# Patient Record
Sex: Female | Born: 1995 | Race: Black or African American | Hispanic: No | Marital: Single | Smoking: Never smoker
Health system: Southern US, Community
[De-identification: ages and names within clinical notes are randomized; demographics above are authoritative.]

## PROBLEM LIST (undated history)

## (undated) ENCOUNTER — Inpatient Hospital Stay (HOSPITAL_COMMUNITY): Payer: Self-pay

## (undated) DIAGNOSIS — A749 Chlamydial infection, unspecified: Secondary | ICD-10-CM

## (undated) DIAGNOSIS — D649 Anemia, unspecified: Secondary | ICD-10-CM

## (undated) HISTORY — PX: NO PAST SURGERIES: SHX2092

## (undated) HISTORY — DX: Anemia, unspecified: D64.9

---
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
## 2017-03-20 ENCOUNTER — Ambulatory Visit: Payer: BLUE CROSS/BLUE SHIELD

## 2017-03-27 ENCOUNTER — Ambulatory Visit (INDEPENDENT_AMBULATORY_CARE_PROVIDER_SITE_OTHER): Payer: BLUE CROSS/BLUE SHIELD | Admitting: Pediatrics

## 2017-03-27 DIAGNOSIS — Z3042 Encounter for surveillance of injectable contraceptive: Secondary | ICD-10-CM | POA: Diagnosis not present

## 2017-03-27 MED ORDER — MEDROXYPROGESTERONE ACETATE 150 MG/ML IM SUSP
150.0000 mg | Freq: Once | INTRAMUSCULAR | Status: AC
  Administered 2017-03-27: 150 mg via INTRAMUSCULAR

## 2017-03-27 NOTE — Progress Notes (Signed)
Patient here for depo-provera  Agree with nursing staff's documentation of this patient's clinic encounter.  Baer Hinton, MD 03/27/2017 11:08 AM    

## 2017-06-12 ENCOUNTER — Other Ambulatory Visit: Payer: Self-pay | Admitting: Obstetrics

## 2017-06-12 DIAGNOSIS — Z30013 Encounter for initial prescription of injectable contraceptive: Secondary | ICD-10-CM

## 2017-06-17 ENCOUNTER — Ambulatory Visit: Payer: BLUE CROSS/BLUE SHIELD

## 2017-06-24 ENCOUNTER — Ambulatory Visit (INDEPENDENT_AMBULATORY_CARE_PROVIDER_SITE_OTHER): Payer: BLUE CROSS/BLUE SHIELD | Admitting: *Deleted

## 2017-06-24 VITALS — BP 113/72 | HR 79 | Wt 170.0 lb

## 2017-06-24 DIAGNOSIS — Z3042 Encounter for surveillance of injectable contraceptive: Secondary | ICD-10-CM | POA: Diagnosis not present

## 2017-06-24 MED ORDER — MEDROXYPROGESTERONE ACETATE 150 MG/ML IM SUSP
150.0000 mg | Freq: Once | INTRAMUSCULAR | Status: AC
  Administered 2017-06-24: 150 mg via INTRAMUSCULAR

## 2017-06-24 NOTE — Progress Notes (Signed)
I have reviewed the chart and agree with nursing staff's documentation of this patient's encounter.  Catalina AntiguaPeggy Kaylah Chiasson, MD 06/24/2017 1:11 PM

## 2017-06-24 NOTE — Progress Notes (Signed)
Pt is in office today for Depo injection. Pt is on time for injection. Injection given, pt tolerated well. Pt has no other concerns today. [facility] next Depo injection.  BP 113/72   Pulse 79   Wt 170 lb (77.1 kg)   BMI 30.11 kg/m    Administrations This Visit    medroxyPROGESTERone (DEPO-PROVERA) injection 150 mg    Admin Date 06/24/2017 Action Given Dose 150 mg Route Intramuscular Administered By Lanney GinsFoster, Suzanne D, CMA

## 2017-09-12 ENCOUNTER — Other Ambulatory Visit: Payer: Self-pay | Admitting: Obstetrics

## 2017-09-12 DIAGNOSIS — Z30013 Encounter for initial prescription of injectable contraceptive: Secondary | ICD-10-CM

## 2017-09-16 ENCOUNTER — Ambulatory Visit: Payer: BLUE CROSS/BLUE SHIELD

## 2017-09-16 ENCOUNTER — Encounter: Payer: Self-pay | Admitting: Obstetrics

## 2017-09-23 ENCOUNTER — Telehealth: Payer: Self-pay | Admitting: *Deleted

## 2017-09-23 NOTE — Telephone Encounter (Signed)
Left vmail for patient to callback and reschedule missed DEPO shot appt.

## 2017-10-04 ENCOUNTER — Other Ambulatory Visit (HOSPITAL_COMMUNITY)
Admission: RE | Admit: 2017-10-04 | Discharge: 2017-10-04 | Disposition: A | Discharge status: Home / Self Care | Payer: BLUE CROSS/BLUE SHIELD | Source: Ambulatory Visit | Attending: Obstetrics | Admitting: Obstetrics

## 2017-10-04 ENCOUNTER — Encounter: Payer: Self-pay | Admitting: Obstetrics

## 2017-10-04 ENCOUNTER — Ambulatory Visit (INDEPENDENT_AMBULATORY_CARE_PROVIDER_SITE_OTHER): Payer: BLUE CROSS/BLUE SHIELD | Admitting: Obstetrics

## 2017-10-04 VITALS — BP 113/71 | Ht 63.0 in | Wt 171.2 lb

## 2017-10-04 DIAGNOSIS — Z30016 Encounter for initial prescription of transdermal patch hormonal contraceptive device: Secondary | ICD-10-CM

## 2017-10-04 DIAGNOSIS — Z3202 Encounter for pregnancy test, result negative: Secondary | ICD-10-CM

## 2017-10-04 DIAGNOSIS — Z3009 Encounter for other general counseling and advice on contraception: Secondary | ICD-10-CM

## 2017-10-04 DIAGNOSIS — Z113 Encounter for screening for infections with a predominantly sexual mode of transmission: Secondary | ICD-10-CM

## 2017-10-04 DIAGNOSIS — N898 Other specified noninflammatory disorders of vagina: Secondary | ICD-10-CM

## 2017-10-04 DIAGNOSIS — Z3042 Encounter for surveillance of injectable contraceptive: Secondary | ICD-10-CM

## 2017-10-04 DIAGNOSIS — Z01419 Encounter for gynecological examination (general) (routine) without abnormal findings: Secondary | ICD-10-CM

## 2017-10-04 LAB — POCT URINE PREGNANCY: Preg Test, Ur: NEGATIVE

## 2017-10-04 MED ORDER — NORELGESTROMIN-ETH ESTRADIOL 150-35 MCG/24HR TD PTWK: 1.0000 | MEDICATED_PATCH | TRANSDERMAL | 12 refills | Status: DC

## 2017-10-04 NOTE — Progress Notes (Signed)
Pt presents for annual and all STD testing. Pt wishes to change birth control from Depo to pill or patch.

## 2017-10-04 NOTE — Progress Notes (Signed)
Subjective:        Carol Oconnell is a 22 y.o. female here for a routine exam.  Current complaints: None.    Personal health questionnaire:  Is patient Carol Oconnell, have a family history of breast and/or ovarian cancer: no Is there a family history of uterine cancer diagnosed at age < 3150, gastrointestinal cancer, urinary tract cancer, family member who is a Carol officerLynch syndrome-associated carrier: no Is the patient overweight and hypertensive, family history of diabetes, personal history of gestational diabetes, preeclampsia or PCOS: no Is patient over 4755, have PCOS,  family history of premature CHD under age 20, diabetes, smoke, have hypertension or peripheral artery disease:  no At any time, has a partner hit, kicked or otherwise hurt or frightened you?: no Over the past 2 weeks, have you felt down, depressed or hopeless?: no Over the past 2 weeks, have you felt little interest or pleasure in doing things?:no   Gynecologic History No LMP recorded. Patient has had an injection. Contraception: Depo-Provera injections Last Pap: n/a. Results were: n/a Last mammogram: n/a. Results were: n/a  Obstetric History OB History  Gravida Para Term Preterm AB Living  2 2 2     2   SAB TAB Ectopic Multiple Live Births        0 2    # Outcome Date GA Lbr Len/2nd Weight Sex Delivery Anes PTL Lv  2 Term 07/21/15 1447w1d 02:02 / 00:05 7 lb 6.9 oz (3.37 kg) M Vag-Spont None  LIV  1 Term 01/12/14 4242w4d  7 lb 7.8 oz (3.395 kg) M Vag-Spont EPI  LIV    Past Medical History:  Diagnosis Date  . Anemia   . Chlamydia     Past Surgical History:  Procedure Laterality Date  . NO PAST SURGERIES       Current Outpatient Medications:  .  acetaminophen (TYLENOL) 500 MG tablet, Take 1,000 mg by mouth every 6 (six) hours as needed for mild pain. Reported on 09/01/2015, Disp: , Rfl:  .  medroxyPROGESTERone (DEPO-PROVERA) 150 MG/ML injection, USE AS DIRECTED AT PHYSICIAN'S OFFICE EVERY 3 MONTHS (Patient not  taking: Reported on 10/04/2017), Disp: 1 mL, Rfl: 3 .  norelgestromin-ethinyl estradiol (XULANE) 150-35 MCG/24HR transdermal patch, Place 1 patch onto the skin once a week., Disp: 3 patch, Rfl: 12 No Known Allergies  Social History   Tobacco Use  . Smoking status: Never Smoker  . Smokeless tobacco: Never Used  Substance Use Topics  . Alcohol use: No    Family History  Problem Relation Age of Onset  . Hypertension Mother   . Asthma Brother       Review of Systems  Constitutional: negative for fatigue and weight loss Respiratory: negative for cough and wheezing Cardiovascular: negative for chest pain, fatigue and palpitations Gastrointestinal: negative for abdominal pain and change in bowel habits Musculoskeletal:negative for myalgias Neurological: negative for gait problems and tremors Behavioral/Psych: negative for abusive relationship, depression Endocrine: negative for temperature intolerance    Genitourinary:negative for abnormal menstrual periods, genital lesions, hot flashes, sexual problems and vaginal discharge Integument/breast: negative for breast lump, breast tenderness, nipple discharge and skin lesion(s)    Objective:       BP 113/71   Ht 5\' 3"  (1.6 m)   Wt 171 lb 3.2 oz (77.7 kg)   BMI 30.33 kg/m  General:   alert  Skin:   no rash or abnormalities  Lungs:   clear to auscultation bilaterally  Heart:   regular rate and  rhythm, S1, S2 normal, no murmur, click, rub or gallop  Breasts:   normal without suspicious masses, skin or nipple changes or axillary nodes  Abdomen:  normal findings: no organomegaly, soft, non-tender and no hernia  Pelvis:  External genitalia: normal general appearance Urinary system: urethral meatus normal and bladder without fullness, nontender Vaginal: normal without tenderness, induration or masses Cervix: normal appearance Adnexa: normal bimanual exam Uterus: anteverted and non-tender, normal size   Lab Review Urine pregnancy  test Labs reviewed yes Radiologic studies reviewed no  50% of 20 min visit spent on counseling and coordination of care.   Assessment:     1. Encounter for gynecological examination  2. Vaginal discharge Rx: - Cervicovaginal ancillary only  3. Encounter for surveillance of injectable contraceptive - wants to discontinue Depo  4. Encounter for other general counseling and advice on contraception - wants to start the Patch  5. Encounter for initial prescription of transdermal patch hormonal contraceptive device Rx: - POCT urine pregnancy - norelgestromin-ethinyl estradiol [employer]) 150-35 MCG/24HR transdermal patch; Place 1 patch onto the skin once a week.  Dispense: 3 patch; Refill: 12  6. Screening for STD (sexually transmitted disease) Rx: - Hepatitis B surface antigen - Hepatitis C antibody - HIV antibody - RPR    Plan:    Education reviewed: calcium supplements, depression evaluation, low fat, low cholesterol diet, safe sex/STD prevention, self breast exams and weight bearing exercise. Contraception: Ortho-Evra patches weekly. Follow up in: 1 year.   Meds ordered this encounter  Medications  . norelgestromin-ethinyl estradiol [employer]) 150-35 MCG/24HR transdermal patch    Sig: Place 1 patch onto the skin once a week.    Dispense:  3 patch    Refill:  12   Orders Placed This Encounter  Procedures  . Hepatitis B surface antigen  . Hepatitis C antibody  . HIV antibody  . RPR  . POCT urine pregnancy    Brock Bad MD 10-04-2017

## 2017-10-05 LAB — RPR: RPR: NONREACTIVE

## 2017-10-05 LAB — HEPATITIS B SURFACE ANTIGEN: Hepatitis B Surface Ag: NEGATIVE

## 2017-10-05 LAB — HIV ANTIBODY (ROUTINE TESTING W REFLEX): HIV SCREEN 4TH GENERATION: NONREACTIVE

## 2017-10-05 LAB — HEPATITIS C ANTIBODY: Hep C Virus Ab: <0.1 s/co ratio (ref 0.0–0.9)

## 2017-10-08 LAB — CERVICOVAGINAL ANCILLARY ONLY
BACTERIAL VAGINITIS: POSITIVE — AB
CANDIDA VAGINITIS: POSITIVE — AB
CHLAMYDIA, DNA PROBE: POSITIVE — AB
Neisseria Gonorrhea: NEGATIVE
Trichomonas: NEGATIVE

## 2017-10-09 ENCOUNTER — Other Ambulatory Visit: Payer: Self-pay | Admitting: Obstetrics

## 2017-10-09 DIAGNOSIS — B3731 Acute candidiasis of vulva and vagina: Secondary | ICD-10-CM

## 2017-10-09 DIAGNOSIS — A749 Chlamydial infection, unspecified: Secondary | ICD-10-CM

## 2017-10-09 DIAGNOSIS — B9689 Other specified bacterial agents as the cause of diseases classified elsewhere: Secondary | ICD-10-CM

## 2017-10-09 DIAGNOSIS — N760 Acute vaginitis: Secondary | ICD-10-CM

## 2017-10-09 DIAGNOSIS — B373 Candidiasis of vulva and vagina: Secondary | ICD-10-CM

## 2017-10-09 MED ORDER — TINIDAZOLE 500 MG PO TABS: 1000.0000 mg | ORAL_TABLET | Freq: Every day | ORAL | 2 refills | Status: DC

## 2017-10-09 MED ORDER — CEFIXIME 400 MG PO CAPS: 400.0000 mg | ORAL_CAPSULE | Freq: Once | ORAL | 0 refills | Status: AC

## 2017-10-09 MED ORDER — FLUCONAZOLE 150 MG PO TABS: 150.0000 mg | ORAL_TABLET | Freq: Once | ORAL | 0 refills | Status: AC

## 2017-10-09 MED ORDER — AZITHROMYCIN 500 MG PO TABS
1000.0000 mg | ORAL_TABLET | Freq: Once | ORAL | 0 refills | Status: AC
Start: 2017-10-09 — End: 2017-10-09

## 2018-02-17 ENCOUNTER — Other Ambulatory Visit: Payer: Self-pay

## 2018-02-17 ENCOUNTER — Ambulatory Visit (INDEPENDENT_AMBULATORY_CARE_PROVIDER_SITE_OTHER): Payer: Self-pay | Admitting: Family Medicine

## 2018-02-17 VITALS — BP 110/60 | HR 94 | Temp 98.3°F | Ht 63.0 in | Wt 171.0 lb

## 2018-02-17 DIAGNOSIS — Z3202 Encounter for pregnancy test, result negative: Secondary | ICD-10-CM

## 2018-02-17 DIAGNOSIS — N926 Irregular menstruation, unspecified: Secondary | ICD-10-CM | POA: Insufficient documentation

## 2018-02-17 LAB — POCT URINE PREGNANCY: PREG TEST UR: NEGATIVE

## 2018-02-17 NOTE — Progress Notes (Signed)
   Subjective   Patient ID: Carol Oconnell    DOB: 14-Jan-1996, 22 y.o. female   MRN: [id number]  CC: "missed period"  HPI: Carol Oconnell is a 22 y.o. female who presents to clinic today for the following:  ABNORMAL UTERIN BLEEDING  Having absent vaginal bleeding for 22 days past last LMP. Bleeding is: absent, was 5-7 days heavy every 28 day cycles Sex in last month: yes Possible STD exposure: yes, had GC/Chlamydia, no h/o PID or h/o abdominal pain Family history of uterine or vaginal cancer: no  Symptoms Weight loss: no Weight gain: yes since 2018 after depo, otherwise no Trouble with vision: no Headaches: no but has h/o frontal h/a Dysuria: no Abdomen or pelvic pain: no Back pain: no Genital sores or ulcers: no Pain during sex: no   ROS: see HPI for pertinent.  PMFSH: Reviewed. Smoking status reviewed. Medications reviewed.  Objective   BP 110/60 (BP Location: Left Arm, Patient Position: Sitting, Cuff Size: Normal)   Pulse 94   Temp 98.3 F (36.8 C) (Oral)   Ht 5\' 3"  (1.6 m)   Wt 171 lb (77.6 kg)   LMP 12/23/2017   SpO2 100%   BMI 30.29 kg/m  Vitals and nursing note reviewed.  General: well nourished, well developed, NAD with non-toxic appearance HEENT: normocephalic, atraumatic, moist mucous membranes Cardiovascular: regular rate and rhythm without murmurs, rubs, or gallops Lungs: clear to auscultation bilaterally with normal work of breathing Abdomen: soft, non-tender, non-distended, normoactive bowel sounds Skin: warm, dry, no rashes or lesions, cap refill < 2 seconds Extremities: warm and well perfused, normal tone, no edema  Assessment & Plan   Irregular menses Acute on chronic.  Seems to be ovulatory in nature.  Urine pregnancy negative.  Patient is attempting to conceive. - Reassured patient that this may take a few months to resolve and to contact clinic at the beginning of next year if she continues to be without periods for referral to OB/GYN -  Reviewed return precautions  Orders Placed This Encounter  Procedures  . POCT urine pregnancy   No orders of the defined types were placed in this encounter.   Durward Parcelavid McMullen, DO [facility], PGY-3 02/17/2018, 5:10 PM

## 2018-02-17 NOTE — Assessment & Plan Note (Signed)
Acute on chronic.  Seems to be ovulatory in nature.  Urine pregnancy negative.  Patient is attempting to conceive. - Reassured patient that this may take a few months to resolve and to contact clinic at the beginning of next year if she continues to be without periods for referral to OB/GYN - Reviewed return precautions

## 2018-02-17 NOTE — Patient Instructions (Signed)
Thank you for coming in to see us today. Please see below to review our plan for today's visit.  Your urine pregnancy test was negative.  Abnormal uterine bleeds are a common issue in females.  This may take several months to resolve.  Contact our clinic at the beginning of next year if you continue to have issues and we can arrange for you to see an OB/GYN doctor.  This typically will resolve on its own.  Please call the clinic at [phone] if your symptoms worsen or you have any concerns. It was our pleasure to serve you.  Durward Parcelavid McMullen, DO [facility], PGY-3

## 2018-04-21 ENCOUNTER — Other Ambulatory Visit: Payer: Self-pay

## 2018-04-21 ENCOUNTER — Ambulatory Visit (INDEPENDENT_AMBULATORY_CARE_PROVIDER_SITE_OTHER): Payer: Medicaid Other | Admitting: Student in an Organized Health Care Education/Training Program

## 2018-04-21 ENCOUNTER — Encounter: Payer: Self-pay | Admitting: Student in an Organized Health Care Education/Training Program

## 2018-04-21 VITALS — BP 104/52 | HR 83 | Temp 98.3°F | Wt 168.4 lb

## 2018-04-21 DIAGNOSIS — Z3200 Encounter for pregnancy test, result unknown: Secondary | ICD-10-CM

## 2018-04-21 DIAGNOSIS — N912 Amenorrhea, unspecified: Secondary | ICD-10-CM

## 2018-04-21 DIAGNOSIS — N926 Irregular menstruation, unspecified: Secondary | ICD-10-CM | POA: Diagnosis not present

## 2018-04-21 LAB — POCT URINE PREGNANCY: Preg Test, Ur: NEGATIVE

## 2018-04-21 NOTE — Patient Instructions (Signed)
It was a pleasure seeing you today in our clinic. Here is the treatment plan we have discussed and agreed upon together:  We drew blood work at today's visit. I will call or send you a letter with these results. If you do not hear from me within the next week, please give our office a call.  Our clinic's number is [phone]. Please call with questions or concerns about what we discussed today.  Be well, Dr. Cleota Pellerito   

## 2018-04-21 NOTE — Progress Notes (Signed)
   CC: amenorhea   HPI: Carol Oconnell is a 23 y.o. female with PMH:  Past Medical History:  Diagnosis Date  . Anemia   . Chlamydia    Presenting for amenorrhea.  Patient reports that she has had 5 months of amenorrhea.  She reports that she was previously on Depo-Provera which was last given in April or May.  She has been off of birth control since August for her report.  She reports that she has never had normal periods, with menarche at age 13.  She was subsequently started on OCPs.  She then had two pregnancies and after the second one was started on Depo-Provera.  She was on Depo-Provera for 1 year ending this past spring.  She reports no abdominal pain, no diarrhea or constipation.  No palpitations or chest pain.  No changes in her hair.  She has not noticed any body hair or acne.  Review of Symptoms:  See HPI for ROS.   CC, SH/smoking status, and VS noted.  Objective: BP (!) 104/52   Pulse 83   Temp 98.3 F (36.8 C) (Oral)   Wt 168 lb 6 oz (76.4 kg)   LMP 12/20/2017   SpO2 97%   BMI 29.83 kg/m  GEN: NAD, alert, cooperative, and pleasant. ENMT: normal tympanic light reflex, no nasal polyps,no rhinorrhea, no pharyngeal erythema or exudates NECK: full ROM, no thyromegaly RESPIRATORY: clear to auscultation bilaterally with no wheezes, rhonchi or rales, good effort CV: RRR, no m/r/g, no peripheral edema GI: soft, non-tender, non-distended, no hepatosplenomegaly SKIN: warm and dry, no rashes or lesions NEURO: II-XII grossly intact, normal gait, peripheral sensation intact PSYCH: AAOx3, appropriate affect  Assessment and plan:  Irregular menses Patient has never had normal menses, but has also been on contraception or pregnant almost consistently over the past several years. 4-5 months of amenorrhea deserves a workup for PCOS. Physical exam is reassuring, patient is overweight but no other evidence of hirsutism. - prolactin WNL - TSH WNL - bHCG neg - A1c WNL - can  consider ordering a pelvic ultrasound to rule out PCOS   Orders Placed This Encounter  Procedures  . TSH  . Prolactin  . Hemoglobin A1c  . Follicle stimulating hormone  . Luteinizing hormone  . POCT urine pregnancy    Howard Pouch, MD,MS,  PGY3 04/23/2018 5:28 PM

## 2018-04-22 LAB — LUTEINIZING HORMONE: LH: 23.8 m[IU]/mL

## 2018-04-22 LAB — HEMOGLOBIN A1C
ESTIMATED AVERAGE GLUCOSE: 103 mg/dL
HEMOGLOBIN A1C: 5.2 % (ref 4.8–5.6)

## 2018-04-22 LAB — PROLACTIN: PROLACTIN: 6 ng/mL (ref 4.8–23.3)

## 2018-04-22 LAB — TSH: TSH: 1.64 u[IU]/mL (ref 0.450–4.500)

## 2018-04-22 LAB — FOLLICLE STIMULATING HORMONE: FSH: 6.6 m[IU]/mL

## 2018-04-23 ENCOUNTER — Other Ambulatory Visit: Payer: Self-pay | Admitting: Student in an Organized Health Care Education/Training Program

## 2018-04-23 NOTE — Assessment & Plan Note (Addendum)
Patient has never had normal menses, but has also been on contraception or pregnant almost consistently over the past several years. 4-5 months of amenorrhea deserves a workup for PCOS. Physical exam is reassuring, patient is overweight but no other evidence of hirsutism. - prolactin WNL - TSH WNL - bHCG neg - A1c WNL - can consider ordering a pelvic ultrasound to rule out PCOS

## 2018-12-11 ENCOUNTER — Other Ambulatory Visit: Payer: Self-pay

## 2018-12-11 DIAGNOSIS — Z20822 Contact with and (suspected) exposure to covid-19: Secondary | ICD-10-CM

## 2018-12-13 LAB — NOVEL CORONAVIRUS, NAA: SARS-CoV-2, NAA: NOT DETECTED

## 2019-01-05 ENCOUNTER — Other Ambulatory Visit: Payer: Self-pay

## 2019-01-05 ENCOUNTER — Emergency Department (HOSPITAL_COMMUNITY)
Admission: EM | Admit: 2019-01-05 | Discharge: 2019-01-05 | Disposition: A | Discharge status: Home / Self Care | Payer: No Typology Code available for payment source | Attending: Emergency Medicine | Admitting: Emergency Medicine

## 2019-01-05 ENCOUNTER — Encounter (HOSPITAL_COMMUNITY): Payer: Self-pay

## 2019-01-05 ENCOUNTER — Emergency Department (HOSPITAL_COMMUNITY): Payer: No Typology Code available for payment source

## 2019-01-05 DIAGNOSIS — S339XXA Sprain of unspecified parts of lumbar spine and pelvis, initial encounter: Secondary | ICD-10-CM | POA: Insufficient documentation

## 2019-01-05 DIAGNOSIS — Z79899 Other long term (current) drug therapy: Secondary | ICD-10-CM | POA: Diagnosis not present

## 2019-01-05 DIAGNOSIS — Y999 Unspecified external cause status: Secondary | ICD-10-CM | POA: Diagnosis not present

## 2019-01-05 DIAGNOSIS — Y93I9 Activity, other involving external motion: Secondary | ICD-10-CM | POA: Insufficient documentation

## 2019-01-05 DIAGNOSIS — S39012A Strain of muscle, fascia and tendon of lower back, initial encounter: Secondary | ICD-10-CM

## 2019-01-05 DIAGNOSIS — Y92410 Unspecified street and highway as the place of occurrence of the external cause: Secondary | ICD-10-CM | POA: Diagnosis not present

## 2019-01-05 DIAGNOSIS — S3992XA Unspecified injury of lower back, initial encounter: Secondary | ICD-10-CM | POA: Diagnosis present

## 2019-01-05 LAB — URINALYSIS, ROUTINE W REFLEX MICROSCOPIC
Bilirubin Urine: NEGATIVE
Glucose, UA: NEGATIVE mg/dL
Hgb urine dipstick: NEGATIVE
Ketones, ur: NEGATIVE mg/dL
Nitrite: NEGATIVE
Protein, ur: NEGATIVE mg/dL
Specific Gravity, Urine: 1.027 (ref 1.005–1.030)
pH: 6 (ref 5.0–8.0)

## 2019-01-05 LAB — PREGNANCY, URINE: Preg Test, Ur: NEGATIVE

## 2019-01-05 MED ORDER — IBUPROFEN 400 MG PO TABS
600.0000 mg | ORAL_TABLET | Freq: Once | ORAL | Status: AC
  Administered 2019-01-05: 600 mg via ORAL
  Filled 2019-01-05: qty 1
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
## 2019-03-17 ENCOUNTER — Ambulatory Visit: Payer: Medicaid Other | Attending: Internal Medicine

## 2019-03-17 DIAGNOSIS — Z20822 Contact with and (suspected) exposure to covid-19: Secondary | ICD-10-CM

## 2019-03-18 LAB — NOVEL CORONAVIRUS, NAA: SARS-CoV-2, NAA: NOT DETECTED

## 2019-03-28 ENCOUNTER — Emergency Department (HOSPITAL_COMMUNITY)
Admission: EM | Admit: 2019-03-28 | Discharge: 2019-03-28 | Disposition: A | Discharge status: Home / Self Care | Payer: Medicaid Other | Attending: Emergency Medicine | Admitting: Emergency Medicine

## 2019-03-28 ENCOUNTER — Other Ambulatory Visit: Payer: Self-pay

## 2019-03-28 ENCOUNTER — Encounter (HOSPITAL_COMMUNITY): Payer: Self-pay | Admitting: Emergency Medicine

## 2019-03-28 DIAGNOSIS — N938 Other specified abnormal uterine and vaginal bleeding: Secondary | ICD-10-CM | POA: Insufficient documentation

## 2019-03-28 LAB — COMPREHENSIVE METABOLIC PANEL
ALT: 38 U/L (ref 0–44)
AST: 59 U/L — ABNORMAL HIGH (ref 15–41)
Albumin: 4.1 g/dL (ref 3.5–5.0)
Alkaline Phosphatase: 57 U/L (ref 38–126)
Anion gap: 6 (ref 5–15)
BUN: 9 mg/dL (ref 6–20)
CO2: 28 mmol/L (ref 22–32)
Calcium: 9.1 mg/dL (ref 8.9–10.3)
Chloride: 108 mmol/L (ref 98–111)
Creatinine, Ser: 0.88 mg/dL (ref 0.44–1.00)
GFR calc Af Amer: >60 mL/min (ref >60)
GFR calc non Af Amer: >60 mL/min (ref >60)
Glucose, Bld: 99 mg/dL (ref 70–99)
Potassium: 4.2 mmol/L (ref 3.5–5.1)
Sodium: 142 mmol/L (ref 135–145)
Total Bilirubin: 0.8 mg/dL (ref 0.3–1.2)
Total Protein: 7 g/dL (ref 6.5–8.1)

## 2019-03-28 LAB — CBC
HCT: 37.1 % (ref 36.0–46.0)
Hemoglobin: 12.5 g/dL (ref 12.0–15.0)
MCH: 32.9 pg (ref 26.0–34.0)
MCHC: 33.7 g/dL (ref 30.0–36.0)
MCV: 97.6 fL (ref 80.0–100.0)
Platelets: 247 10*3/uL (ref 150–400)
RBC: 3.8 MIL/uL — ABNORMAL LOW (ref 3.87–5.11)
RDW: 12 % (ref 11.5–15.5)
WBC: 7 10*3/uL (ref 4.0–10.5)
nRBC: 0 % (ref 0.0–0.2)

## 2019-03-28 LAB — WET PREP, GENITAL
Clue Cells Wet Prep HPF POC: NONE SEEN
Sperm: NONE SEEN
Trich, Wet Prep: NONE SEEN
Yeast Wet Prep HPF POC: NONE SEEN

## 2019-03-28 LAB — I-STAT BETA HCG BLOOD, ED (MC, WL, AP ONLY): I-stat hCG, quantitative: <5 m[IU]/mL (ref <5)

## 2019-03-28 LAB — LIPASE, BLOOD: Lipase: 32 U/L (ref 11–51)

## 2019-03-28 MED ORDER — SODIUM CHLORIDE 0.9% FLUSH: 3.0000 mL | Freq: Once | INTRAVENOUS | Status: DC

## 2019-03-28 NOTE — Discharge Instructions (Addendum)
   Your work-up showed no significant abnormalities today.  You will need to follow very closely with a gynecologist.  You may [facility] with [facility] the [address].  It may be difficult to contact them by phone but they do have open hours after 4 PM Monday through Wednesday and you may go directly to the clinic to set up an appointment.  Otherwise you may follow-up with any GYN specialist of your choosing.  You may need to see your primary care physician for referral.  Contact a health care provider if you: Feel light-headed or weak. Have nausea and vomiting. Cannot eat or drink without vomiting. Feel dizzy or have diarrhea while you are taking medicines. Are taking birth control pills or hormones, and you want to change them or stop taking them. Get help right away if: You develop a fever or chills. You need to change your sanitary pad or tampon more than one time per hour. Your vaginal bleeding becomes heavier, or your flow contains clots more often. You develop pain in your abdomen. You lose consciousness. You develop a rash.

## 2019-03-28 NOTE — ED Provider Notes (Signed)
[facility] EMERGENCY DEPARTMENT Provider Note   CSN: [id number] Arrival date & time: 03/28/19  1056     History Chief Complaint  Patient presents with  . Vaginal Bleeding    Carol Oconnell is a 24 y.o. female who presents emergency department chief complaint of vaginal bleeding.  Patient states that she has not had a period for the past 3 years.  She used to be on birth control.  She discontinued it 3 years ago and expected her.  To restart however it never did.  She states that she began bleeding on 03/26/2019.  She states that she normally had a heavy.  However she is soaking a super tampon every 2 hours.  She denies any severe menstrual cramps, vaginal foul odor or discharge.  She states she is not currently sexually active.  She denies fevers, chills, urinary symptoms.  She denies weakness, shortness of breath, feelings of presyncope.  HPI     Past Medical History:  Diagnosis Date  . Anemia   . Chlamydia     Patient Active Problem List   Diagnosis Date Noted  . Irregular menses 02/17/2018  . Routine adult health maintenance 02/21/2016    Past Surgical History:  Procedure Laterality Date  . NO PAST SURGERIES       OB History    Gravida  2   Para  2   Term  2   Preterm      AB      Living  2     SAB      TAB      Ectopic      Multiple  0   Live Births  2           Family History  Problem Relation Age of Onset  . Hypertension Mother   . Asthma Brother     Social History   Tobacco Use  . Smoking status: Never Smoker  . Smokeless tobacco: Never Used  Substance Use Topics  . Alcohol use: No  . Drug use: No    Home Medications Prior to Admission medications   Medication Sig Start Date End Date Taking? Authorizing Provider  acetaminophen (TYLENOL) 500 MG tablet Take 1,000 mg by mouth every 6 (six) hours as needed for mild pain. Reported on 09/01/2015    [provider]  norelgestromin-ethinyl estradiol [employer])  150-35 MCG/24HR transdermal patch Place 1 patch onto the skin once a week. 10/04/17   Brock Bad, MD  tinidazole (TINDAMAX) 500 MG tablet Take 2 tablets (1,000 mg total) by mouth daily with breakfast. 10/09/17   Brock Bad, MD    Allergies    Patient has no known allergies.  Review of Systems   Review of Systems Ten systems reviewed and are negative for acute change, except as noted in the HPI.   Physical Exam Updated Vital Signs BP (!) 107/59 (BP Location: Right Arm)   Pulse 67   Temp 98.4 F (36.9 C) (Oral)   Resp 16   LMP 03/26/2019   SpO2 97%   Physical Exam Vitals and nursing note reviewed. Exam conducted with a chaperone present.  Constitutional:      General: She is not in acute distress.    Appearance: She is well-developed. She is not diaphoretic.  HENT:     Head: Normocephalic and atraumatic.  Eyes:     General: No scleral icterus.    Conjunctiva/sclera: Conjunctivae normal.  Cardiovascular:     Rate  and Rhythm: Normal rate and regular rhythm.     Heart sounds: Normal heart sounds. No murmur. No friction rub. No gallop.   Pulmonary:     Effort: Pulmonary effort is normal. No respiratory distress.     Breath sounds: Normal breath sounds.  Abdominal:     General: Bowel sounds are normal. There is no distension.     Palpations: Abdomen is soft. There is no mass.     Tenderness: There is no abdominal tenderness. There is no guarding.  Genitourinary:    General: Normal vulva.     Vagina: Normal.     Cervix: Cervical bleeding present. No cervical motion tenderness.     Uterus: Normal.      Adnexa: Right adnexa normal and left adnexa normal.  Musculoskeletal:     Cervical back: Normal range of motion.  Skin:    General: Skin is warm and dry.  Neurological:     Mental Status: She is alert and oriented to person, place, and time.  Psychiatric:        Behavior: Behavior normal.     ED Results / Procedures / Treatments   Labs (all labs ordered  are listed, but only abnormal results are displayed) Labs Reviewed  LIPASE, BLOOD  COMPREHENSIVE METABOLIC PANEL  CBC  URINALYSIS, ROUTINE W REFLEX MICROSCOPIC  I-STAT BETA HCG BLOOD, ED ([facility], [facility], [facility] ONLY)    EKG None  Radiology No results found.  Procedures Procedures (including critical care time)  Medications Ordered in ED Medications  sodium chloride flush (NS) 0.9 % injection 3 mL (has no administration in time range)    ED Course  I have reviewed the triage vital signs and the nursing notes.  Pertinent labs & imaging results that were available during my care of the patient were reviewed by me and considered in my medical decision making (see chart for details).  Clinical Course as of Mar 27 1213  Sat Mar 28, 2019  1215 I-stat hCG, quantitative: <5.0 [AH]    Clinical Course User Index [AH] Margarita Mail, PA-C   MDM Rules/Calculators/A&P                      24 year old female who presents with vaginal bleeding.  She has no pelvic pain or vaginal discharge.  I personally viewed the patient's labs which shows pending GC chlamydia swab, wet prep shows a few white blood cells.  CMP shows a mildly elevated AST of insignificant value.  Hemoglobin 12.5.  She has a negative pregnancy test.  Patient also has a benign pelvic examination.  This likely represents dysfunctional uterine bleeding.  I do not see any evidence of vaginal laceration and cervical laceration or other physical cause of bleeding.  She is not feeling dizzy with standing that I doubt her bleeding is leading to significant drop in her hemoglobin.  Her she is hemodynamically stable and have advised the patient to follow closely with the gynecology.  She appears appropriate for discharge at this time Final Clinical Impression(s) / ED Diagnoses Final diagnoses:  Dysfunctional uterine bleeding    Rx / DC Orders ED Discharge Orders    None       Margarita Mail, PA-C 03/28/19 1630    Fredia Sorrow,  MD 04/05/19 [phone]

## 2019-03-28 NOTE — ED Triage Notes (Signed)
Pt states she hasn't had a menstrual cycle in 3 years and started having abd cramping and vaginal bleeding since 12/31.  Initially started as spotting but now heavy with super tampons changed q2 hrs.

## 2019-03-28 NOTE — ED Notes (Signed)
Patient asked for urine sample, she states that she is unable to give sample at this time.

## 2019-03-28 NOTE — ED Notes (Signed)
Patient Alert and oriented to baseline. Stable and ambulatory to baseline. Patient verbalized understanding of the discharge instructions.  Patient belongings were taken by the patient.   

## 2019-03-28 NOTE — ED Notes (Signed)
Patient asked for urine sample, she stated that she was unable to give sample at this time.

## 2019-03-30 LAB — GC/CHLAMYDIA PROBE AMP (~~LOC~~) NOT AT ARMC
Chlamydia: NEGATIVE
Neisseria Gonorrhea: NEGATIVE

## 2019-04-16 ENCOUNTER — Other Ambulatory Visit: Payer: Medicaid Other

## 2019-11-25 ENCOUNTER — Other Ambulatory Visit: Payer: Self-pay

## 2019-11-25 ENCOUNTER — Emergency Department (HOSPITAL_COMMUNITY)
Admission: EM | Admit: 2019-11-25 | Discharge: 2019-11-25 | Disposition: A | Discharge status: Home / Self Care | Payer: HRSA Program | Attending: Emergency Medicine | Admitting: Emergency Medicine

## 2019-11-25 ENCOUNTER — Emergency Department (HOSPITAL_COMMUNITY): Payer: HRSA Program

## 2019-11-25 ENCOUNTER — Encounter (HOSPITAL_COMMUNITY): Payer: Self-pay | Admitting: Emergency Medicine

## 2019-11-25 DIAGNOSIS — U071 COVID-19: Secondary | ICD-10-CM | POA: Insufficient documentation

## 2019-11-25 DIAGNOSIS — R000 Tachycardia, unspecified: Secondary | ICD-10-CM | POA: Insufficient documentation

## 2019-11-25 LAB — SARS CORONAVIRUS 2 BY RT PCR (HOSPITAL ORDER, PERFORMED IN ~~LOC~~ HOSPITAL LAB): SARS Coronavirus 2: POSITIVE — AB

## 2019-11-25 MED ORDER — IPRATROPIUM BROMIDE HFA 17 MCG/ACT IN AERS
2.0000 | INHALATION_SPRAY | Freq: Once | RESPIRATORY_TRACT | Status: AC
  Administered 2019-11-25: 2 via RESPIRATORY_TRACT
  Filled 2019-11-25: qty 12.9

## 2019-11-25 MED ORDER — ACETAMINOPHEN 325 MG PO TABS
650.0000 mg | ORAL_TABLET | Freq: Once | ORAL | Status: AC
  Administered 2019-11-25: 650 mg via ORAL
  Filled 2019-11-25: qty 2

## 2019-11-25 NOTE — ED Provider Notes (Signed)
[facility] EMERGENCY DEPARTMENT Provider Note   CSN: [id number] Arrival date & time: 11/25/19  1324     History Chief Complaint  Patient presents with  . covid sx    Carol Oconnell is a 24 y.o. female who presents to the ED with complaint of COVID symptoms since last night. Pt is unvaccinated; she states that she was around a friend 2 days ago who tested positive. Pt began having chills, a headache, cough, SOB, sore throat, and body aches yesterday. She has been taking Tylenol at home for her headache without much relief. Has not taken anything else for her symptoms. Pt has not been checking her temperature at home as she does not have a thermometer - found to have a fever in the ED at 101.7. Pt denies vision changes, neck stiffness, rash, confusion, abdominal pain, chest pain, diarrhea, constipation, or any other associated symptoms.   The history is provided by the patient and medical records.       Past Medical History:  Diagnosis Date  . Anemia   . Chlamydia     Patient Active Problem List   Diagnosis Date Noted  . Irregular menses 02/17/2018  . Routine adult health maintenance 02/21/2016    Past Surgical History:  Procedure Laterality Date  . NO PAST SURGERIES       OB History    Gravida  2   Para  2   Term  2   Preterm      AB      Living  2     SAB      TAB      Ectopic      Multiple  0   Live Births  2           Family History  Problem Relation Age of Onset  . Hypertension Mother   . Asthma Brother     Social History   Tobacco Use  . Smoking status: Never Smoker  . Smokeless tobacco: Never Used  Vaping Use  . Vaping Use: Never used  Substance Use Topics  . Alcohol use: No  . Drug use: No    Home Medications Prior to Admission medications   Medication Sig Start Date End Date Taking? Authorizing Provider  acetaminophen (TYLENOL) 500 MG tablet Take 1,000 mg by mouth every 6 (six) hours as needed for mild  pain. Reported on 09/01/2015    [provider]  norelgestromin-ethinyl estradiol [employer]ico) 150-35 MCG/24HR transdermal patch Place 1 patch onto the skin once a week. 10/04/17   Brock Bad, MD  tinidazole (TINDAMAX) 500 MG tablet Take 2 tablets (1,000 mg total) by mouth daily with breakfast. 10/09/17   Brock Bad, MD    Allergies    Patient has no known allergies.  Review of Systems   Review of Systems  Constitutional: Positive for chills, fatigue and fever.  Eyes: Negative for visual disturbance.  Respiratory: Positive for cough and shortness of breath.   Cardiovascular: Negative for chest pain.  Gastrointestinal: Positive for nausea. Negative for abdominal pain, constipation, diarrhea and vomiting.  Musculoskeletal: Positive for myalgias.  Neurological: Positive for headaches. Negative for syncope, weakness and numbness.  All other systems reviewed and are negative.   Physical Exam Updated Vital Signs BP 98/76 (BP Location: Right Arm)   Pulse (!) 110   Temp (!) 101.7 F (38.7 C) (Oral)   Resp 18   SpO2 99%   Physical Exam Vitals and nursing note  reviewed.  Constitutional:      Appearance: She is not ill-appearing or diaphoretic.  HENT:     Head: Normocephalic and atraumatic.     Right Ear: Tympanic membrane normal.     Left Ear: Tympanic membrane normal.  Eyes:     Extraocular Movements: Extraocular movements intact.     Conjunctiva/sclera: Conjunctivae normal.     Pupils: Pupils are equal, round, and reactive to light.  Cardiovascular:     Rate and Rhythm: Regular rhythm. Tachycardia present.  Pulmonary:     Effort: Pulmonary effort is normal.     Breath sounds: Normal breath sounds. No wheezing, rhonchi or rales.  Abdominal:     Tenderness: There is no abdominal tenderness. There is no guarding or rebound.  Musculoskeletal:     Cervical back: Normal range of motion. No rigidity.  Skin:    General: Skin is warm and dry.     Coloration: Skin is  not jaundiced.  Neurological:     General: No focal deficit present.     Mental Status: She is alert and oriented to person, place, and time.     Cranial Nerves: No cranial nerve deficit.     ED Results / Procedures / Treatments   Labs (all labs ordered are listed, but only abnormal results are displayed) Labs Reviewed  SARS CORONAVIRUS 2 BY RT PCR (HOSPITAL ORDER, PERFORMED IN [facility]) - Abnormal; Notable for the following components:      Result Value   SARS Coronavirus 2 POSITIVE (*)    All other components within normal limits    EKG None  Radiology DG Chest Port 1 View  Result Date: 11/25/2019 CLINICAL DATA:  Cough, COVID positive EXAM: PORTABLE CHEST 1 VIEW COMPARISON:  None. FINDINGS: The heart size and mediastinal contours are within normal limits. Both lungs are clear. No pleural effusion. The visualized skeletal structures are unremarkable. IMPRESSION: No acute process in the chest. Electronically Signed   By: Guadlupe Spanish M.D.   On: 11/25/2019 16:15    Procedures Procedures (including critical care time)  Medications Ordered in ED Medications  acetaminophen (TYLENOL) tablet 650 mg (650 mg Oral Given 11/25/19 1558)  ipratropium (ATROVENT HFA) inhaler 2 puff (2 puffs Inhalation Given 11/25/19 1615)    ED Course  I have reviewed the triage vital signs and the nursing notes.  Pertinent labs & imaging results that were available during my care of the patient were reviewed by me and considered in my medical decision making (see chart for details).  Clinical Course as of Nov 24 1701  Wed Nov 25, 2019  1518 SARS Coronavirus 2(!): POSITIVE [MV]    Clinical Course User Index [MV] Tanda Rockers, PA-C   MDM Rules/Calculators/A&P                          24 year old female who is on vaccinated who presents to the ED today with complaint of cough, sore throat, body aches, headache for the past day.  Was exposed to someone who tested positive for Covid  earlier this week.  On arrival to the ED patient is febrile 101.7 and tachycardic at 1 time.  She had a Covid test done while she was in the waiting room which has returned positive.  On my exam patient has no focal neuro deficits and no meningeal signs.  She is still tchycardic, was not given anything for her fever on arrival. Will provide tylenol in the  ED and assess if this brings down HR. Pt does not appear dehydrated today; do not feel she needs IV fluids. Will obtain CXR given complaint of SOB and cough. Will provide inhaler. Will plan to ambulate patient with pulse ox to ensure she does not desat.   HR has decreased to 101 after Tylenol. Have encouraged patient to increase oral intake at home to stay hydrated. She has ambulated with pulse ox maintained at 98-100% on RA. CXR clear. Will discharge patient home at this time. Instructed to self isolate for 2 weeks and to monitor symptoms at home. Strict return precautions discussed. Pt is in agreement with plan and stable for discharge.   This note was prepared using Dragon voice recognition software and may include unintentional dictation errors due to the inherent limitations of voice recognition software.  Carol Oconnell was evaluated in Emergency Department on 11/25/2019 for the symptoms described in the history of present illness. She was evaluated in the context of the global COVID-19 pandemic, which necessitated consideration that the patient might be at risk for infection with the SARS-CoV-2 virus that causes COVID-19. Institutional protocols and algorithms that pertain to the evaluation of patients at risk for COVID-19 are in a state of rapid change based on information released by regulatory bodies including the CDC and federal and state organizations. These policies and algorithms were followed during the patient's care in the ED.  Final Clinical Impression(s) / ED Diagnoses Final diagnoses:  COVID-19    Rx / DC Orders ED Discharge Orders      None       Discharge Instructions     You have tested POSITIVE for COVID 19 today. You will need to self isolate for 14 days starting today (cleared: 09/16).   Use the albuterol inhaler as needed. Take Tylenol and Ibuprofen as needed for your fever, headaches, and body aches. Drink plenty of fluids to stay hydrated. I would recommend buying a pulse oximeter (can be bought off [employer]) to check your oxygen levels. If the level is persistently < 88% you will need to come back to the ED IMMEDIATELY for further evaluation.   Return to the ED for reasons as above as well as any worsening shortness of breath, severe chest pain, lips/fingers turning blue, passing out, or any other new/concerning symptoms.        Tanda Rockers, PA-C 11/25/19 1703    Pricilla Loveless, MD 11/26/19 [phone]

## 2019-11-25 NOTE — ED Notes (Signed)
Patient verbalizes understanding of discharge instructions. Opportunity for questioning and answers were provided. Armband removed by staff, pt discharged from ED and ambulated to lobby to leave to go home with mother.

## 2019-11-25 NOTE — ED Notes (Signed)
PT 02 sat maintained 98-100% while ambulating

## 2019-11-25 NOTE — Discharge Instructions (Addendum)
You have tested POSITIVE for COVID 19 today. You will need to self isolate for 14 days starting today (cleared: 09/16).   Use the albuterol inhaler as needed. Take Tylenol and Ibuprofen as needed for your fever, headaches, and body aches. Drink plenty of fluids to stay hydrated. I would recommend buying a pulse oximeter (can be bought off [employer]) to check your oxygen levels. If the level is persistently < 88% you will need to come back to the ED IMMEDIATELY for further evaluation.   Return to the ED for reasons as above as well as any worsening shortness of breath, severe chest pain, lips/fingers turning blue, passing out, or any other new/concerning symptoms.

## 2019-11-25 NOTE — ED Triage Notes (Signed)
Pt c/o sob, sore throat, increase weakness and HA for the past 4 days.

## 2019-11-26 ENCOUNTER — Telehealth: Payer: Self-pay | Admitting: Family

## 2019-11-26 NOTE — Telephone Encounter (Signed)
Called to Discuss with patient about Covid symptoms and the use of the monoclonal antibody infusion for those with mild to moderate Covid symptoms and at a high risk of hospitalization.     Pt appears to qualify for this infusion due to co-morbid conditions and/or a member of an at-risk group in accordance with the FDA Emergency Use Authorization.    Carol Oconnell was seen in the [facility] ED with chills, cough, SOB, sore throat and body aches that started on 8/31. Testing was positive for COVID. Risk factors include a BMI >25.  Attempted to reach [name] and left message on voicemail with hotline number. Additionally a MyChart message was sent with information as well.   Marcos Eke, NP 11/26/2019  2:11 PM

## 2020-09-17 ENCOUNTER — Encounter (HOSPITAL_COMMUNITY): Payer: Self-pay

## 2020-09-17 ENCOUNTER — Emergency Department (HOSPITAL_COMMUNITY): Payer: Medicaid Other

## 2020-09-17 ENCOUNTER — Emergency Department (HOSPITAL_COMMUNITY)
Admission: EM | Admit: 2020-09-17 | Discharge: 2020-09-18 | Disposition: A | Discharge status: Home / Self Care | Payer: Medicaid Other | Attending: Emergency Medicine | Admitting: Emergency Medicine

## 2020-09-17 ENCOUNTER — Other Ambulatory Visit: Payer: Self-pay

## 2020-09-17 DIAGNOSIS — Z23 Encounter for immunization: Secondary | ICD-10-CM | POA: Insufficient documentation

## 2020-09-17 DIAGNOSIS — S99922A Unspecified injury of left foot, initial encounter: Secondary | ICD-10-CM

## 2020-09-17 DIAGNOSIS — W450XXA Nail entering through skin, initial encounter: Secondary | ICD-10-CM | POA: Insufficient documentation

## 2020-09-17 DIAGNOSIS — T148XXA Other injury of unspecified body region, initial encounter: Secondary | ICD-10-CM

## 2020-09-17 DIAGNOSIS — S91332A Puncture wound without foreign body, left foot, initial encounter: Secondary | ICD-10-CM | POA: Insufficient documentation

## 2020-09-17 MED ORDER — TETANUS-DIPHTH-ACELL PERTUSSIS 5-2.5-18.5 LF-MCG/0.5 IM SUSY
0.5000 mL | PREFILLED_SYRINGE | Freq: Once | INTRAMUSCULAR | Status: AC
  Administered 2020-09-17: 0.5 mL via INTRAMUSCULAR
  Filled 2020-09-17: qty 0.5

## 2020-09-17 MED ORDER — OXYCODONE-ACETAMINOPHEN 5-325 MG PO TABS
1.0000 | ORAL_TABLET | Freq: Once | ORAL | Status: AC
  Administered 2020-09-17: 1 via ORAL
  Filled 2020-09-17: qty 1

## 2020-09-17 MED ORDER — CIPROFLOXACIN HCL 500 MG PO TABS: 500.0000 mg | ORAL_TABLET | Freq: Two times a day (BID) | ORAL | 0 refills | Status: DC

## 2020-09-17 MED ORDER — CIPROFLOXACIN HCL 500 MG PO TABS
500.0000 mg | ORAL_TABLET | Freq: Once | ORAL | Status: AC
  Administered 2020-09-18: 500 mg via ORAL
  Filled 2020-09-17: qty 1

## 2020-09-17 NOTE — ED Provider Notes (Signed)
[facility]-EMERGENCY DEPT Provider Note   CSN: [id number] Arrival date & time: 09/17/20  2152     History Chief Complaint  Patient presents with   Foot Injury    Carol Oconnell is a 25 y.o. female with no pertinent past medical history.  Patient is emergency department with a chief complaint of injury to left foot.  Patient reports that she stepped on a nail outside approximately 30 minutes prior to arrival in emergency department.  Patient states that now it through her croc and embedded in her foot.  Patient removed nail on scene.  She is unsure how deep the nail went into her foot.  Patient complains of pain to her entire sole of her foot.  Patient rates pain 9/10 on the pain scale.  It is worse with pressure and touch.  Patient has not tried any modalities to alleviate her pain.  Patient denies any numbness, weakness.  Patient reports that last tetanus shot occurred 5 years prior.     Foot Injury      Past Medical History:  Diagnosis Date   Anemia    Chlamydia     Patient Active Problem List   Diagnosis Date Noted   Irregular menses 02/17/2018   Routine adult health maintenance 02/21/2016    Past Surgical History:  Procedure Laterality Date   NO PAST SURGERIES       OB History     Gravida  2   Para  2   Term  2   Preterm      AB      Living  2      SAB      IAB      Ectopic      Multiple  0   Live Births  2           Family History  Problem Relation Age of Onset   Hypertension Mother    Asthma Brother     Social History   Tobacco Use   Smoking status: Never   Smokeless tobacco: Never  Vaping Use   Vaping Use: Never used  Substance Use Topics   Alcohol use: No   Drug use: No    Home Medications Prior to Admission medications   Medication Sig Start Date End Date Taking? Authorizing Provider  acetaminophen (TYLENOL) 500 MG tablet Take 1,000 mg by mouth every 6 (six) hours as needed for mild pain. Reported  on 09/01/2015    [provider]  norelgestromin-ethinyl estradiol [facility]) 150-35 MCG/24HR transdermal patch Place 1 patch onto the skin once a week. 10/04/17   Brock Bad, MD  tinidazole (TINDAMAX) 500 MG tablet Take 2 tablets (1,000 mg total) by mouth daily with breakfast. 10/09/17   Brock Bad, MD    Allergies    Patient has no known allergies.  Review of Systems   Review of Systems  Skin:  Positive for wound. Negative for color change, pallor and rash.  Neurological:  Negative for weakness and numbness.   Physical Exam Updated Vital Signs BP 123/72 (BP Location: Right Arm)   Pulse 88   Temp 98.3 F (36.8 C) (Oral)   Resp 18   Ht 5\' 3"  (1.6 m)   Wt 72.1 kg   LMP 09/17/2020   SpO2 100%   BMI 28.17 kg/m   Physical Exam Vitals and nursing note reviewed.  Constitutional:      General: She is not in acute distress.  Appearance: She is not ill-appearing, toxic-appearing or diaphoretic.  HENT:     Head: Normocephalic.  Eyes:     General: No scleral icterus.       Right eye: No discharge.        Left eye: No discharge.  Cardiovascular:     Rate and Rhythm: Normal rate.     Pulses:          Dorsalis pedis pulses are 2+ on the left side.  Pulmonary:     Effort: Pulmonary effort is normal.  Musculoskeletal:     Left foot: Normal range of motion.       Feet:  Feet:     Left foot:     Skin integrity: Skin integrity normal.     Toenail Condition: Left toenails are normal.     Comments: Puncture wound to left midfoot.  Tenderness throughout entire sole of foot..  Patient has full extension and flexion of digits to left foot.  Sensation intact to all digits of left foot.  Refill less than 2 seconds in all digits of left foot. Skin:    General: Skin is warm and dry.  Neurological:     General: No focal deficit present.     Mental Status: She is alert.  Psychiatric:        Behavior: Behavior is cooperative.    ED Results / Procedures / Treatments    Labs (all labs ordered are listed, but only abnormal results are displayed) Labs Reviewed - No data to display  EKG None  Radiology No results found.  Procedures Procedures   Medications Ordered in ED Medications  oxyCODONE-acetaminophen (PERCOCET/ROXICET) 5-325 MG per tablet 1 tablet (has no administration in time range)  Tdap (BOOSTRIX) injection 0.5 mL (has no administration in time range)    ED Course  I have reviewed the triage vital signs and the nursing notes.  Pertinent labs & imaging results that were available during my care of the patient were reviewed by me and considered in my medical decision making (see chart for details).    MDM Rules/Calculators/A&P                          Alert 25 year old female no acute stress, nontoxic-appearing.  Patient presents emerged part with a chief complaint of left foot injury.  Patient reports that she stepped on a nail just prior to arrival in the emergency department.  Nail went through her croc and embedded in her foot.  Removed nail on scene.  Patient has a puncture wound to the left midfoot.  No other sign of injury.  Will obtain x-ray imaging to evaluate for possible fracture, dislocation, or retained foreign body.  Will update patient's tetanus shot.  We will start patient on 5-day course of Cipro for Pseudomonas coverage.  Wound was cleaned, irrigated, and Dermabond applied.  X-ray imaging pending at this time.  Patient care transferred to PA McDonald at the end of my shift. Patient presentation, ED course, and plan of care discussed with review of all pertinent labs and imaging. Please see his/her note for further details regarding further ED course and disposition.     Final Clinical Impression(s) / ED Diagnoses Final diagnoses:  None    Rx / DC Orders ED Discharge Orders     None        Berneice Heinrich 09/18/20 Judie Petit, MD 09/19/20 [phone]

## 2020-09-17 NOTE — ED Triage Notes (Signed)
Pt states that she stepped on a nail with her Crocs on about 30 minutes ago. The nail went through the shoe and into her left foot, she had to pull it out.

## 2020-09-17 NOTE — Discharge Instructions (Addendum)
You came to the emergency department today to be evaluated for your puncture wound.  Your physical exam was reassuring.  The x-ray obtained showed no broken bones, dislocations, or retained foreign bodies.  Because of your injury I have updated your tetanus shot and given you a prescription for antibiotics.  Please take 1 pill twice daily for the next 5 days.  Please take Ibuprofen (Advil, motrin) and Tylenol (acetaminophen) to relieve your pain.    You may take up to 600 MG (3 pills) of normal strength ibuprofen every 8 hours as needed.   You make take tylenol, up to 1,000 mg (two extra strength pills) every 8 hours as needed.   It is safe to take ibuprofen and tylenol at the same time as they work differently.   Do not take more than 3,000 mg tylenol in a 24 hour period (not more than one dose every 8 hours.  Please check all medication labels as many medications such as pain and cold medications may contain tylenol.  Do not drink alcohol while taking these medications.  Do not take other NSAID'S while taking ibuprofen (such as aleve or naproxen).  Please take ibuprofen with food to decrease stomach upset.  You may have diarrhea from the antibiotics.  It is very important that you continue to take the antibiotics even if you get diarrhea unless a medical professional tells you that you may stop taking them.  If you stop too early the bacteria you are being treated for will become stronger and you may need different, more powerful antibiotics that have more side effects and worsening diarrhea.  Please stay well hydrated and consider probiotics as they may decrease the severity of your diarrhea.  Please be aware that if you take any hormonal contraception (birth control pills, nexplanon, the ring, etc) that your birth control will not work while you are taking antibiotics and you need to use back up protection as directed on the birth control medication information insert.   Get help right away if: You  develop severe swelling around your wound. Your pain suddenly increases and is severe. You develop painful skin lumps. You have a red streak going away from your wound. The wound is on your hand or foot and you: Cannot properly move a finger or toe. Notice that your fingers or toes look pale or bluish.

## 2020-09-18 NOTE — ED Provider Notes (Signed)
25 year old female received at signout pending x-ray.  Please see PA Badalamente's note for further work-up and medical decision making.  X-ray has been reviewed and independently interpreted by me.  No retained foreign bodies, fractures, or other acute abnormalities.    Patient was examined in the ED.  It Dermabond was noted to be applied to puncture wound.  She is neurovascular intact.  No signs of surrounding infection.  Given mechanism of injury, I removed Dermabond at bedside.  Discussed with patient due to concern for her risk of infection.  She was agreeable with this plan.  Vaseline gauze and dressing were applied at bedside.  She was given her first dose of ciprofloxacin in the emergency department.  ER return precautions discussed.  She will follow-up with her primary care provider for a wound recheck.  She is hemodynamically stable in no acute distress.  Safer discharge to home with outpatient follow-up.   Frederik Pear A, PA-C 09/18/20 0108    Gilda Crease, MD 09/18/20 [phone]

## 2020-09-18 NOTE — ED Notes (Signed)
Pt verbalized understanding of d/c, medication, and follow up care. Ambulatory with steady gait. vaseline and bandage applied to wound.

## 2020-10-18 ENCOUNTER — Ambulatory Visit: Payer: Self-pay

## 2020-10-19 ENCOUNTER — Ambulatory Visit (INDEPENDENT_AMBULATORY_CARE_PROVIDER_SITE_OTHER): Payer: Self-pay

## 2020-10-19 ENCOUNTER — Other Ambulatory Visit: Payer: Self-pay

## 2020-10-19 DIAGNOSIS — Z111 Encounter for screening for respiratory tuberculosis: Secondary | ICD-10-CM

## 2020-10-19 NOTE — Progress Notes (Signed)
Patient is here for a PPD placement.  PPD placed in left forearm @ 3:25 pm.  Patient will return 10/21/2020 to have PPD read. Veronda Prude, RN

## 2020-10-21 ENCOUNTER — Ambulatory Visit (INDEPENDENT_AMBULATORY_CARE_PROVIDER_SITE_OTHER): Payer: Self-pay

## 2020-10-21 ENCOUNTER — Other Ambulatory Visit: Payer: Self-pay

## 2020-10-21 DIAGNOSIS — Z111 Encounter for screening for respiratory tuberculosis: Secondary | ICD-10-CM

## 2020-10-21 LAB — TB SKIN TEST
Induration: 0 mm
TB Skin Test: NEGATIVE

## 2020-10-21 NOTE — Progress Notes (Signed)
Patient is here for a PPD read.  It was placed on 10/19/2020 in the left forearm @ 3:20 pm.    PPD RESULTS:  Result: negative Induration: 0 mm  Letter created and given to patient for documentation purposes. Veronda Prude, RN

## 2020-11-01 ENCOUNTER — Other Ambulatory Visit: Payer: Self-pay

## 2020-11-01 ENCOUNTER — Other Ambulatory Visit (HOSPITAL_COMMUNITY)
Admission: RE | Admit: 2020-11-01 | Discharge: 2020-11-01 | Disposition: A | Discharge status: Home / Self Care | Payer: Self-pay | Source: Ambulatory Visit | Attending: Family Medicine | Admitting: Family Medicine

## 2020-11-01 ENCOUNTER — Ambulatory Visit (INDEPENDENT_AMBULATORY_CARE_PROVIDER_SITE_OTHER): Payer: Self-pay | Admitting: Family Medicine

## 2020-11-01 ENCOUNTER — Encounter: Payer: Self-pay | Admitting: Family Medicine

## 2020-11-01 VITALS — BP 114/60 | HR 78 | Ht 63.0 in | Wt 160.0 lb

## 2020-11-01 DIAGNOSIS — N898 Other specified noninflammatory disorders of vagina: Secondary | ICD-10-CM

## 2020-11-01 DIAGNOSIS — L709 Acne, unspecified: Secondary | ICD-10-CM

## 2020-11-01 DIAGNOSIS — Z124 Encounter for screening for malignant neoplasm of cervix: Secondary | ICD-10-CM

## 2020-11-01 DIAGNOSIS — Z0000 Encounter for general adult medical examination without abnormal findings: Secondary | ICD-10-CM

## 2020-11-01 DIAGNOSIS — R519 Headache, unspecified: Secondary | ICD-10-CM | POA: Insufficient documentation

## 2020-11-01 LAB — POCT WET PREP (WET MOUNT)
Clue Cells Wet Prep Whiff POC: POSITIVE
Trichomonas Wet Prep HPF POC: ABSENT

## 2020-11-01 MED ORDER — SUMATRIPTAN SUCCINATE 100 MG PO TABS: 50.0000 mg | ORAL_TABLET | ORAL | 0 refills | Status: DC | PRN

## 2020-11-01 MED ORDER — CLINDAMYCIN PHOS-BENZOYL PEROX 1-5 % EX GEL: Freq: Two times a day (BID) | CUTANEOUS | 0 refills | Status: DC

## 2020-11-01 NOTE — Assessment & Plan Note (Addendum)
Obtained wet prep, std testing and pap smear today. Declined flu and covid vaccines.

## 2020-11-01 NOTE — Progress Notes (Signed)
SUBJECTIVE:   Chief compliant/HPI: annual examination  Carol Oconnell is a 25 y.o. who presents today for an annual exam.    Vaginal Discharge Patient reports on going vaginal discharge.  She notes that discharge appears clear, white.  She has vaginal odors.  She denies vaginal pruritis, abnormal vaginal bleeding, dysuria, hematuria, frequency, abdominal pain pelvic pain, nausea, vomiting, fevers or new rash.   Does have history of STIs.  She is sexually active with females only.  Was last sexually active with a female in 2019. LMP: 2nd week of June. She does not douche.   Headache  Onset: gradual  Location: frontal region  Quality: aching  Frequency: few time a week Precipitating factors: stress  Prior treatment: none  Ibuprofen and tylenol helps a little.  9/10  Associated Symptoms Nausea/vomiting: no  Photophobia/phonophobia: yes-photophobia  Tearing of eyes: no  Sinus pain/pressure: no  Family hx migraine: no  Personal stressors: no  Relation to menstrual cycle: no   Red Flags Fever: no  Neck pain/stiffness: no  Vision/speech/swallow/hearing difficulty: no  Focal weakness/numbness: no  Altered mental status: no  Trauma: no  New type of headache: no  Anticoagulant use: no  H/o cancer/HIV/Pregnancy: no  Updated history tabs and problem list.   Acne  Pt reports acne on forehead. Has tried various brands of face products without much improvement.   OBJECTIVE:   BP 114/60   Pulse 78   Ht _0  (1.6 m)   Wt 160 lb (72.6 kg)   LMP 09/02/2020 (Approximate)   BMI 28.34 kg/m    General: Alert, no acute distress Cardio: well perfused  Pulm: normal work of breathing Neuro: Cranial nerve exam normal    Pelvic Exam chaperoned by CMA Jazmin        External: normal female genitalia without lesions or masses        Vagina: normal without lesions or masses        Cervix: normal without lesions or masses        Pap smear: performed        Samples for Wet prep,  GC/Chlamydia obtained   ASSESSMENT/PLAN:   Routine adult health maintenance Obtained wet prep, std testing and pap smear today. Declined flu and covid vaccines.   Headache Tension headache VS migraine. No red flags which is reassuring. Recommended pt takes adequately hydrated, eats 3 meals a day plus snacks in between, adequate sleep, tylenol 1064m Q6H, motrin 6041mQ8H with headaches and also prescribed Imitrex 5040mH2PRN as abortive therapy. Follow up as needed.  Acne Mild acne over forehead. Recommend salicylic acid cleansers and washing face BID. Avoiding fragranced beauty products. Prescribed clindamycin-benzoyl peroxide gel. Follow up as needed.    Annual Examination  See AVS for age appropriate recommendations.   PHQ score 0, reviewed and discussed. Blood pressure reviewed and at goal .  Asked about intimate partner violence-none .  The patient currently uses none for contraception. Folate recommended as appropriate, minimum of 400 mcg per day.  Advanced directives-not interested   Considered the following items based upon USPSTF recommendations: HIV testing:  declined  Hepatitis C:  declined  Hepatitis B:  declined  Syphilis if at high risk:  not idnicated  GC/CT  ordered Lipid panel (nonfasting or fasting) discussed based upon AHA recommendations and   Consider repeat every 4-6 years.  Reviewed risk factors for latent tuberculosis and not indicated  Discussed family history, BRCA testing not indicated. Tool used to risk stratify  was Pedigree Assessment tool  Cervical cancer screening: due for Pap today, cytology + HPV ordered Immunizations discussed    Follow up in 1 year or sooner if indicated.    Lattie Haw, MD [place]

## 2020-11-01 NOTE — Patient Instructions (Signed)
Thank you for coming to see me today. It was a pleasure.  We performed STD testing and a pap today. This will take a few days to come back. If your MyChart is activated, we will message you on there if everything is normal, otherwise we will call. If we need to treat something we will also call you. If you do not hear from [place] in the next 4 days, please give [place] a call.   For the headache/migraines I recommended: 1000mg  tylenol every 6 hours Motrin 600mg  every 8 hours Sumtriptan 100mg  within 2 hours of getting the headache   Follow up with me as and when you need.    If you have any questions or concerns, please do not hesitate to call the office at [phone].  Best wishes,   Dr 

## 2020-11-02 ENCOUNTER — Telehealth: Payer: Self-pay | Admitting: Family Medicine

## 2020-11-02 ENCOUNTER — Other Ambulatory Visit: Payer: Self-pay | Admitting: Family Medicine

## 2020-11-02 DIAGNOSIS — L709 Acne, unspecified: Secondary | ICD-10-CM | POA: Insufficient documentation

## 2020-11-02 MED ORDER — METRONIDAZOLE 500 MG PO TABS: 500.0000 mg | ORAL_TABLET | Freq: Two times a day (BID) | ORAL | 0 refills | Status: AC

## 2020-11-02 NOTE — Assessment & Plan Note (Signed)
Tension headache VS migraine. No red flags which is reassuring. Recommended pt takes adequately hydrated, eats 3 meals a day plus snacks in between, adequate sleep, tylenol 1000mg  Q6H, motrin 600mg  Q8H with headaches and also prescribed Imitrex 50mg  QH2PRN as abortive therapy. Follow up as needed.

## 2020-11-02 NOTE — Assessment & Plan Note (Signed)
Mild acne over forehead. Recommend salicylic acid cleansers and washing face BID. Avoiding fragranced beauty products. Prescribed clindamycin-benzoyl peroxide gel. Follow up as needed.

## 2020-11-02 NOTE — Telephone Encounter (Signed)
Left HIPPA compliant VM regarding test results yesterday. Will try again later.

## 2020-11-02 NOTE — Telephone Encounter (Signed)
Erroneous

## 2020-11-03 LAB — CYTOLOGY - PAP
Chlamydia: NEGATIVE
Comment: NEGATIVE
Comment: NEGATIVE
Comment: NORMAL
Diagnosis: NEGATIVE
Diagnosis: REACTIVE
High risk HPV: NEGATIVE
Neisseria Gonorrhea: NEGATIVE

## 2021-06-18 IMAGING — DX DG CHEST 1V PORT
1 series · 1 of 1 positions shown · non-contrast
Comparison: None.

CLINICAL DATA: Cough, COVID positive

EXAM:
PORTABLE CHEST 1 VIEW

[chest]
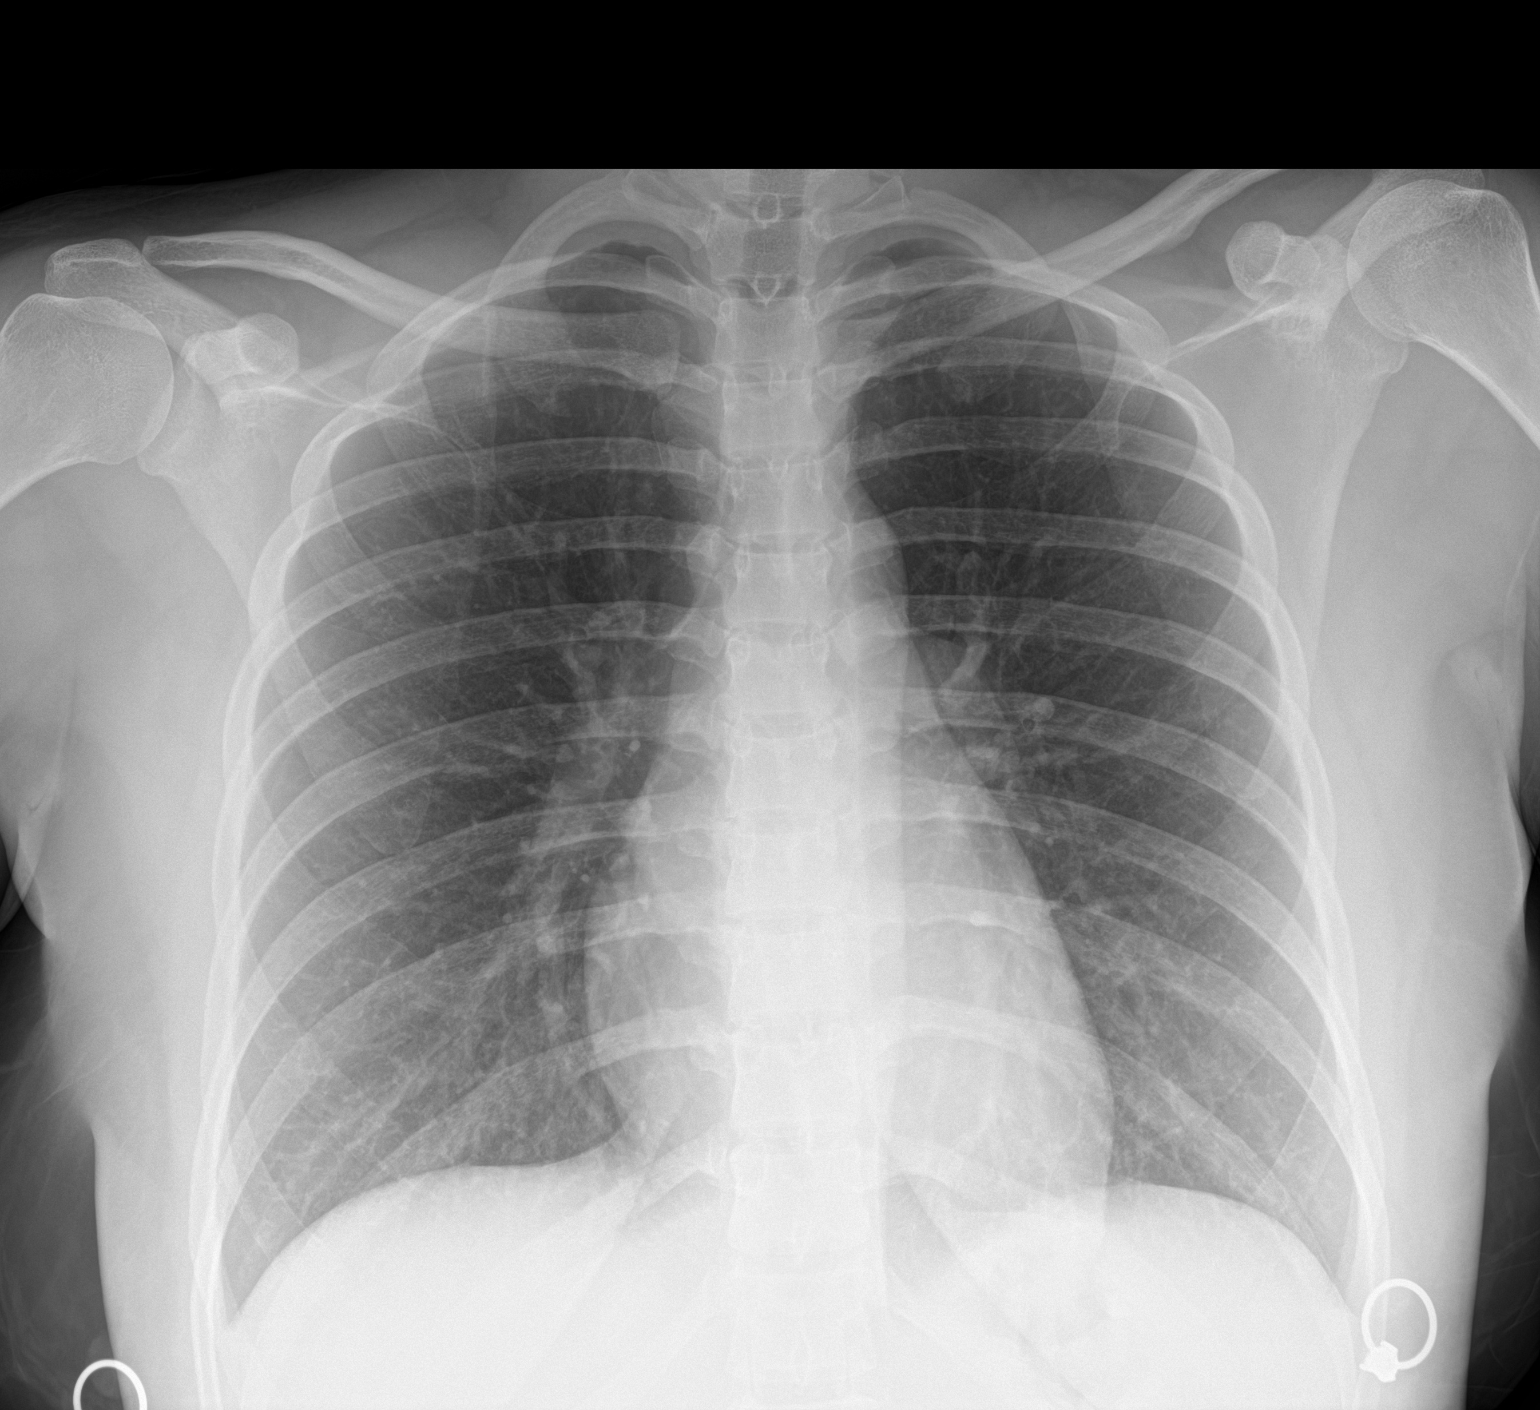

[1 of 1 positions shown; findings below may reference images not displayed]

FINDINGS: The heart size and mediastinal contours are within normal limits.
Both lungs are clear. No pleural effusion. The visualized skeletal
structures are unremarkable.
IMPRESSION: No acute process in the chest.

## 2021-08-29 ENCOUNTER — Encounter: Payer: Self-pay | Admitting: *Deleted

## 2021-09-08 ENCOUNTER — Ambulatory Visit (INDEPENDENT_AMBULATORY_CARE_PROVIDER_SITE_OTHER): Payer: Self-pay | Admitting: Family Medicine

## 2021-09-08 VITALS — BP 113/71 | HR 78 | Ht 63.0 in | Wt 167.0 lb

## 2021-09-08 DIAGNOSIS — R000 Tachycardia, unspecified: Secondary | ICD-10-CM

## 2021-09-08 DIAGNOSIS — F419 Anxiety disorder, unspecified: Secondary | ICD-10-CM

## 2021-09-08 DIAGNOSIS — R009 Unspecified abnormalities of heart beat: Secondary | ICD-10-CM

## 2021-09-08 DIAGNOSIS — F32A Depression, unspecified: Secondary | ICD-10-CM

## 2021-09-08 NOTE — Progress Notes (Unsigned)
     SUBJECTIVE:   CHIEF COMPLAINT / HPI:   Carol Oconnell is a 26 y.o. female presents for follow-up  Pt is concerned about resting HR on her apple watch between 67-150. When she checks her HR she is always sitting down. Pt denies palpitations, chest pain, dizziness or dyspnea. Drinks 1-2 sodas a day. Binge drinks on the weekends: >10 shots. Did have a binge last week.  No hx thyroid disorder. No illicit drug use.  Does have anxiety. Is not taking medications. Is not doing therapy anymore. Currently HR is 97.  Flowsheet Row Office Visit from 09/08/2021 in [facility]  PHQ-9 Total Score 14      PERTINENT  PMH / PSH:   OBJECTIVE:   BP 113/71   Pulse 78   Ht 5\' 3"  (1.6 m)   Wt 167 lb (75.8 kg)   LMP 08/28/2021 (Exact Date)   SpO2 100%   BMI 29.58 kg/m    General: Alert, no acute distress Cardio: Normal S1 and S2, RRR, no r/m/g Pulm: CTAB, normal work of breathing Abdomen: Bowel sounds normal. Abdomen soft and non-tender.  Extremities: No peripheral edema.  Neuro: Cranial nerves grossly intact   ASSESSMENT/PLAN:   No problem-specific Assessment & Plan notes found for this encounter.    10/28/2021, MD PGY-3 [facility]

## 2021-09-08 NOTE — Patient Instructions (Signed)
Thank you for coming to see me today. It was a pleasure. Today we discussed your heart rate, it was not high in clinic today which is ressuring.  Normal heart rate is 60-100. I recommend some general lab work. Anxiety might be contributing if you heart rate gets high.   Please follow-up with me in 1 week for anxiety follow up   If you have any questions or concerns, please do not hesitate to call the office at [phone].  Best wishes,   Dr Allena Katz

## 2021-09-09 LAB — TSH: TSH: 1.21 u[IU]/mL (ref 0.450–4.500)

## 2021-09-09 LAB — CBC
Hematocrit: 38.8 % (ref 34.0–46.6)
Hemoglobin: 13.3 g/dL (ref 11.1–15.9)
MCH: 32.9 pg (ref 26.6–33.0)
MCHC: 34.3 g/dL (ref 31.5–35.7)
MCV: 96 fL (ref 79–97)
Platelets: 249 10*3/uL (ref 150–450)
RBC: 4.04 x10E6/uL (ref 3.77–5.28)
RDW: 12.1 % (ref 11.7–15.4)
WBC: 8.1 10*3/uL (ref 3.4–10.8)

## 2021-09-11 DIAGNOSIS — F32A Depression, unspecified: Secondary | ICD-10-CM | POA: Insufficient documentation

## 2021-09-11 DIAGNOSIS — F419 Anxiety disorder, unspecified: Secondary | ICD-10-CM | POA: Insufficient documentation

## 2021-09-11 DIAGNOSIS — R009 Unspecified abnormalities of heart beat: Secondary | ICD-10-CM | POA: Insufficient documentation

## 2021-09-11 NOTE — Assessment & Plan Note (Signed)
Normal HR today in clinic at 78. Normal cardiac exam, regular rate and rhythm. Low suspicion for arrthymia. Reassured pt that apple watch is likely incorrect with HR readings. Obtained general lab work to rule our organic pathology-TSH and CBC. Provided counseling on reducing alcohol intake. Strict ER precautions given.

## 2021-09-11 NOTE — Assessment & Plan Note (Addendum)
PHQ 9 14 today. No SI. Pt reports she has a combination of both anxiety and depression. Recommended follow up with me to discuss further management on 8:50am. Consider starting SSRI.

## 2021-09-15 ENCOUNTER — Encounter: Payer: Self-pay | Admitting: Family Medicine

## 2021-09-15 ENCOUNTER — Ambulatory Visit (INDEPENDENT_AMBULATORY_CARE_PROVIDER_SITE_OTHER): Payer: Self-pay | Admitting: Family Medicine

## 2021-09-15 ENCOUNTER — Other Ambulatory Visit: Payer: Self-pay

## 2021-09-15 DIAGNOSIS — F419 Anxiety disorder, unspecified: Secondary | ICD-10-CM

## 2021-09-15 DIAGNOSIS — F32A Depression, unspecified: Secondary | ICD-10-CM

## 2021-09-15 MED ORDER — HYDROXYZINE HCL 25 MG PO TABS: 25.0000 mg | ORAL_TABLET | Freq: Three times a day (TID) | ORAL | 0 refills | Status: DC | PRN

## 2021-09-15 MED ORDER — ESCITALOPRAM OXALATE 10 MG PO TABS: 10.0000 mg | ORAL_TABLET | Freq: Every day | ORAL | 0 refills | Status: DC

## 2021-09-15 NOTE — Progress Notes (Addendum)
     SUBJECTIVE:   CHIEF COMPLAINT / HPI:   Carol Oconnell is a 26 y.o. female presents for mood follow up  Depression, anxiety Pt reports very stressed, anxious and sad recently.  She is "always in her head" about where she is in life. 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  The environment at work is hostile.  In the past in life, he was having some issues.  Her best friend is moving to [place] next week which has made her upset. Her other friend's boyfriend doesn't like her friendship group and she said that she will not be spending as much time with them.  Patient feels she suffers from attachment and abandoment issues.  Her anxiety and depression affect her daily life.  She lives with her 2 children, age 53 and 15 and mom at home. Her son has ADHD, anxiety and depression.  She is not close to her mom.her mom is a better mom to her children than her mom is to.  She feels her mom treats other people better than she treats her. She starting going to her girlfriends house more often as it is a "safe space".  No history of suicide attempt.  No SI. No manic episodes.    Flowsheet Row Office Visit from 09/15/2021 in [facility]  PHQ-9 Total Score 16        PERTINENT  PMH / PSH: irregular menses, acne  OBJECTIVE:   BP 115/71   Pulse 85   Wt 169 lb 6.4 oz (76.8 kg)   LMP 08/28/2021 (Exact Date)   SpO2 100%   BMI 30.01 kg/m    General: Alert, no acute distress Cardio: well perfused  Pulm: normal work of breathing Neuro: Cranial nerves grossly intact   ASSESSMENT/PLAN:   Anxiety and depression PHQ 16, GAD 16. No SI. Worsening of chronic anxiety and depression. Patient has not tried medications.  Pt agreeable to starting medication today. No hx of mania. Started on Lexapro 10mg  and hydroxyzine 25mg  as needed, up to 3 times a day. Provided pt with medicaid therapy resources. Follow up with me in 2 weeks.  Will consider increasing Lexapro to 20 mg if  tolerating well.    Towanda Octave, MD PGY-3 [facility]

## 2021-09-29 ENCOUNTER — Encounter: Payer: Self-pay | Admitting: Family Medicine

## 2021-09-29 ENCOUNTER — Ambulatory Visit (INDEPENDENT_AMBULATORY_CARE_PROVIDER_SITE_OTHER): Payer: Self-pay | Admitting: Family Medicine

## 2021-09-29 ENCOUNTER — Other Ambulatory Visit (HOSPITAL_COMMUNITY)
Admission: RE | Admit: 2021-09-29 | Discharge: 2021-09-29 | Disposition: A | Discharge status: Home / Self Care | Payer: Medicaid Other | Source: Ambulatory Visit | Attending: Family Medicine | Admitting: Family Medicine

## 2021-09-29 VITALS — BP 102/60 | HR 62 | Ht 63.0 in | Wt 165.0 lb

## 2021-09-29 DIAGNOSIS — F32A Depression, unspecified: Secondary | ICD-10-CM

## 2021-09-29 DIAGNOSIS — N760 Acute vaginitis: Secondary | ICD-10-CM | POA: Diagnosis not present

## 2021-09-29 DIAGNOSIS — F419 Anxiety disorder, unspecified: Secondary | ICD-10-CM

## 2021-09-29 DIAGNOSIS — B9689 Other specified bacterial agents as the cause of diseases classified elsewhere: Secondary | ICD-10-CM | POA: Diagnosis not present

## 2021-09-29 DIAGNOSIS — Z113 Encounter for screening for infections with a predominantly sexual mode of transmission: Secondary | ICD-10-CM

## 2021-09-29 DIAGNOSIS — Z0000 Encounter for general adult medical examination without abnormal findings: Secondary | ICD-10-CM

## 2021-09-29 MED ORDER — ESCITALOPRAM OXALATE 20 MG PO TABS: 20.0000 mg | ORAL_TABLET | Freq: Every day | ORAL | 0 refills | Status: DC

## 2021-09-29 NOTE — Patient Instructions (Signed)
Thank you for coming to see me today. It was a pleasure. Glad your mood is better, I have increased lexapro to 20mg , 90 day supply.   We performed STD testing today. This will take a few days to come back. If your MyChart is activated, we will message you on there if everything is normal, otherwise we will call. If we need to treat something we will also call you. If you do not hear from in the next 4 days, please give [place] a call.   We will get some labs today.  If they are abnormal or we need to do something about them, I will call you.  If they are normal, I will send you a message on MyChart (if it is active) or a letter in the mail.  If you don't hear from [place] in 2 weeks, please call the office at the number below.  If you have any questions or concerns, please do not hesitate to call the office at [phone].  Best wishes,   Dr [phone]

## 2021-09-29 NOTE — Assessment & Plan Note (Addendum)
Routine STD testing performed including HIV, hep c and RPR labs. Safe sex precautions provided to pt. Pap up to date and due in 2025.  Provided patient printout of last TB test 11 months ago which was negative.

## 2021-09-29 NOTE — Assessment & Plan Note (Addendum)
Much improved compared to previous visit. GAD 7 18 (slightly worse from 16), PHQ 9 12 (improved from 16). No SI. Increased Lexapro 20mg , provided pt 90 day supply. Continue hydroxyzine as needed. Continue to encourage therapy and also to find other support people/strategies as I suspect it would be a large adjustment for her when her friend moved to and this will affect her mood.  Patient expressed understanding.  Provided work note for patient.

## 2021-09-29 NOTE — Progress Notes (Signed)
     SUBJECTIVE:   CHIEF COMPLAINT / HPI:   Carol Oconnell is a 26 y.o. female presents for mood follow up   Anxiety, depression Patient reports that her mood is much improved compared to 2 weeks ago.  No SI.  She is compliant with Lexapro 10 mg daily and hydroxyzine 25 mg as needed.  She has needed to take the hydroxyzine 3 times and it helped her with acute anxiety.  Her best friend is moving to [place] for about a month which is also helped her anxiety.  She reports that she will find it very difficult when she leaves.  Sexually active with female partner.  She has a new sexual partner since her last STD test.  Patient does have a history of chlamydia.     09/29/2021    9:04 AM 09/15/2021    9:30 AM  GAD 7 : Generalized Anxiety Score  Nervous, Anxious, on Edge 3 3  Control/stop worrying 2 2  Worry too much - different things 3 2  Trouble relaxing 2 2  Restless 3 3  Easily annoyed or irritable 3 2  Afraid - awful might happen 2 2  Total GAD 7 Score 18 16  Anxiety Difficulty  Very difficult          09/29/2021    9:03 AM 09/15/2021    9:27 AM 09/08/2021    2:01 PM  PHQ9 SCORE ONLY  PHQ-9 Total Score 12 16 14        Health Maintenance Due  Topic   COVID-19 Vaccine (1)   CHLAMYDIA SCREENING       PERTINENT  PMH / PSH:  chlamydia, anxiety, depression  OBJECTIVE:   BP 102/60   Pulse 62   Ht 5\' 3"  (1.6 m)   Wt 165 lb (74.8 kg)   LMP 09/25/2021 (Exact Date)   BMI 29.23 kg/m    General: Alert, no acute distress Cardio: well perfused  Pulm: normal work of breathing Neuro: Cranial nerves grossly intact   Pelvic Exam chaperoned by CMA Jess        External: normal female genitalia without lesions or masses        Vagina: normal without lesions or masses        Samples for Wet prep, GC/Chlamydia obtained   ASSESSMENT/PLAN:   Routine adult health maintenance Routine STD testing performed including HIV, hep c and RPR labs. Safe sex precautions provided to pt. Pap  up to date and due in 2025.  Provided patient printout of last TB test 11 months ago which was negative.  Anxiety and depression Much improved compared to previous visit. GAD 7 18 (slightly worse from 16), PHQ 9 12 (improved from 16). No SI. Increased Lexapro 20mg , provided pt 90 day supply. Continue hydroxyzine as needed. Continue to encourage therapy and also to find other support people/strategies as I suspect it would be a large adjustment for her when her friend moved to 11/26/2021 and this will affect her mood.  Patient expressed understanding.  Provided work note for patient.    2026, MD PGY-3 [facility]

## 2021-09-30 LAB — HIV ANTIBODY (ROUTINE TESTING W REFLEX): HIV Screen 4th Generation wRfx: NONREACTIVE

## 2021-09-30 LAB — RPR: RPR Ser Ql: NONREACTIVE

## 2021-09-30 LAB — HEPATITIS C ANTIBODY: Hep C Virus Ab: NONREACTIVE

## 2021-10-02 ENCOUNTER — Encounter: Payer: Self-pay | Admitting: Family Medicine

## 2021-10-02 LAB — CERVICOVAGINAL ANCILLARY ONLY
Bacterial Vaginitis (gardnerella): POSITIVE — AB
Candida Glabrata: NEGATIVE
Candida Vaginitis: NEGATIVE
Chlamydia: NEGATIVE
Comment: NEGATIVE
Comment: NEGATIVE
Comment: NEGATIVE
Comment: NEGATIVE
Comment: NEGATIVE
Comment: NORMAL
Neisseria Gonorrhea: NEGATIVE
Trichomonas: NEGATIVE

## 2021-10-05 ENCOUNTER — Other Ambulatory Visit: Payer: Self-pay | Admitting: Family Medicine

## 2021-10-05 MED ORDER — METRONIDAZOLE 500 MG PO TABS
500.0000 mg | ORAL_TABLET | Freq: Two times a day (BID) | ORAL | 0 refills | Status: AC
Start: 2021-10-05 — End: 2021-10-12

## 2021-10-06 ENCOUNTER — Telehealth: Payer: Self-pay | Admitting: Student

## 2021-10-06 NOTE — Telephone Encounter (Signed)
Patient's mother dropped off health examination form to be completed. Last DOS was 09/29/21. Placed in [employer].

## 2021-10-09 NOTE — Telephone Encounter (Signed)
Clinical info completed on Health Exam Certificate form.  Placed form in [place]'s box for completion.    When form is completed, please route note to "RN Team" and place in wall pocket in front office.   Lavante Toso Zimmerman Rumple, CMA

## 2021-10-12 NOTE — Telephone Encounter (Signed)
Section 2 needs completion. Handed forms to provider to complete.   Thanks.   Veronda Prude, RN

## 2021-10-13 NOTE — Telephone Encounter (Signed)
Patient called, LVM and informed that forms are ready for pick up. Copy made and placed in batch scanning. Original placed at front desk for pick up.   Zeynep Fantroy C Tashaya Ancrum, RN  

## 2021-11-09 ENCOUNTER — Encounter: Payer: Self-pay | Admitting: Family Medicine

## 2021-11-09 ENCOUNTER — Ambulatory Visit (INDEPENDENT_AMBULATORY_CARE_PROVIDER_SITE_OTHER): Payer: Self-pay | Admitting: Family Medicine

## 2021-11-09 VITALS — BP 105/72 | HR 66 | Ht 63.0 in | Wt 169.8 lb

## 2021-11-09 DIAGNOSIS — N3000 Acute cystitis without hematuria: Secondary | ICD-10-CM

## 2021-11-09 DIAGNOSIS — F419 Anxiety disorder, unspecified: Secondary | ICD-10-CM

## 2021-11-09 DIAGNOSIS — F32A Depression, unspecified: Secondary | ICD-10-CM

## 2021-11-09 DIAGNOSIS — R350 Frequency of micturition: Secondary | ICD-10-CM

## 2021-11-09 LAB — POCT URINALYSIS DIP (MANUAL ENTRY)
Bilirubin, UA: NEGATIVE
Glucose, UA: NEGATIVE mg/dL
Ketones, POC UA: NEGATIVE mg/dL
Nitrite, UA: NEGATIVE
Protein Ur, POC: NEGATIVE mg/dL
Spec Grav, UA: 1.02 (ref 1.010–1.025)
Urobilinogen, UA: 1 E.U./dL
pH, UA: 8 (ref 5.0–8.0)

## 2021-11-09 LAB — POCT UA - MICROSCOPIC ONLY: Epithelial cells, urine per micros: >20

## 2021-11-09 MED ORDER — NITROFURANTOIN MONOHYD MACRO 100 MG PO CAPS: 100.0000 mg | ORAL_CAPSULE | Freq: Two times a day (BID) | ORAL | 0 refills | Status: DC

## 2021-11-09 MED ORDER — ESCITALOPRAM OXALATE 20 MG PO TABS: 20.0000 mg | ORAL_TABLET | Freq: Every day | ORAL | 0 refills | Status: DC

## 2021-11-09 NOTE — Progress Notes (Signed)
    SUBJECTIVE:   CHIEF COMPLAINT / HPI: urinary freqency  Frequency urinating Patient reports 1 day history of increased frequency, denies burning or other pain on urinating.  Reports urine looks cloudy, no blood.  Denies vaginal discharge.  Reports she did miss her period in July after several regular monthly cycles.  Patient reports having no female partners for 5 years.  Patient is not on birth control.  Patient strongly endorses she has no concerns for STD.  Patient has previously had UTIs.  Depression/anxiety Patient reports Lexapro is doing well.  PHQ-9 score of 13.  Negative to questions 9.  PERTINENT  PMH / PSH: Anxiety and depression  OBJECTIVE:   BP 105/72   Pulse 66   Ht 5\' 3"  (1.6 m)   Wt 169 lb 12.8 oz (77 kg)   SpO2 100%   BMI 30.08 kg/m   General: Sitting comfortably in clinic chair Abdomen: Soft, no suprapubic tenderness, nondistended, bladder not palpable  ASSESSMENT/PLAN:   Urinary frequency Patient reports history consistent with simple urinary tract infection.  UA showed leukocytes and possible infection, will treat empirically with Macrobid.  Unlikely STD given history and low risk for pregnancy. -Macrobid 100 mg twice daily for 5 days -Follow-up as needed  Anxiety and depression Stable.  PHQ-9 13 from 12 last time.  Patient reports Lexapro is helping her. - Refilled Lexapro 20 mg     , MD [facility]

## 2021-11-09 NOTE — Assessment & Plan Note (Signed)
Stable.  PHQ-9 13 from 12 last time.  Patient reports Lexapro is helping her. - Refilled Lexapro 20 mg

## 2021-11-09 NOTE — Assessment & Plan Note (Addendum)
Patient reports history consistent with simple urinary tract infection.  UA showed leukocytes and possible infection, will treat empirically with Macrobid.  Unlikely STD given history and low risk for pregnancy. -Macrobid 100 mg twice daily for 5 days -Follow-up as needed

## 2021-11-09 NOTE — Patient Instructions (Signed)
It was great to see you! Thank you for allowing me to participate in your care!  I recommend that you always bring your medications to each appointment as this makes it easy to ensure we are on the correct medications and helps [place] not miss when refills are needed.  Our plans for today:  -We tested your urine for infection, it showed a possible infection. -I have prescribed you Macrobid 100 mg tablets twice a day for 5 days.  We are checking your urine culture, I will call you if it is abnormal will send you a MyChart message or a letter if they are normal.  If you do not hear about your labs in the next 2 weeks please let us know.  Take care and seek immediate care sooner if you develop any concerns.   Dr. Celine Mans, MD [facility]

## 2021-11-13 LAB — URINE CULTURE

## 2022-02-09 ENCOUNTER — Encounter: Payer: Self-pay | Admitting: Student

## 2022-02-09 ENCOUNTER — Ambulatory Visit (INDEPENDENT_AMBULATORY_CARE_PROVIDER_SITE_OTHER): Payer: Self-pay | Admitting: Student

## 2022-02-09 VITALS — BP 100/62 | HR 89 | Wt 185.8 lb

## 2022-02-09 DIAGNOSIS — R350 Frequency of micturition: Secondary | ICD-10-CM

## 2022-02-09 DIAGNOSIS — N926 Irregular menstruation, unspecified: Secondary | ICD-10-CM

## 2022-02-09 DIAGNOSIS — H9192 Unspecified hearing loss, left ear: Secondary | ICD-10-CM

## 2022-02-09 LAB — POCT URINALYSIS DIP (MANUAL ENTRY)
Bilirubin, UA: NEGATIVE
Glucose, UA: NEGATIVE mg/dL
Ketones, POC UA: NEGATIVE mg/dL
Nitrite, UA: POSITIVE — AB
Protein Ur, POC: NEGATIVE mg/dL
Spec Grav, UA: 1.02 (ref 1.010–1.025)
Urobilinogen, UA: 1 E.U./dL
pH, UA: 6.5 (ref 5.0–8.0)

## 2022-02-09 LAB — POCT UA - MICROSCOPIC ONLY: Epithelial cells, urine per micros: >20

## 2022-02-09 MED ORDER — NITROFURANTOIN MONOHYD MACRO 100 MG PO CAPS: 100.0000 mg | ORAL_CAPSULE | Freq: Two times a day (BID) | ORAL | 0 refills | Status: DC

## 2022-02-09 NOTE — Progress Notes (Signed)
    SUBJECTIVE:   CHIEF COMPLAINT / HPI:   Carol Oconnell is a 26 year old female with her partner here to discuss irregular menses and desire for pregnancy.  Irregular menses She has had irregular menses for years.  First menstrual cycle was in the fifth grade.  She had regular periods up until she started birth control, and then she started struggling with regular periods.  She will have a period once every 2 months or so. Has never been diagnosed with PCOS or endometriosis, does not have history of either of these in her family. Does not struggle with acne or excessive facial hair. LMP 01/14/2022, was regular in terms of duration and severity of bleeding and symptoms.  Last used hormonal birth control was in 2019. She and her partner Carol Oconnell are planning on artificial insemination, but wanted to come to today's appointment to make sure that there was nothing abnormal that would prevent him from being able to have a child.   Dysuria Carol Oconnell reports she has been urinating more frequently.  Denies any burning with urination.  Denies fever, chills, abdominal pain, hematuria.    PERTINENT  PMH / PSH: Reviewed  OBJECTIVE:   BP 100/62   Pulse 89   Wt 185 lb 12.8 oz (84.3 kg)   LMP 01/14/2022   SpO2 98%   BMI 32.91 kg/m   General: Well-appearing, no distress Respiratory: Normal work of breathing on room air Skin: Warm, well-perfused   ASSESSMENT/PLAN:   Irregular menses Irregular, but regular periods given that they occur about every 2 months.  Has had work-up in the past for abnormal periods.  Can consider PCOS and endometriosis, but this is much less likely as she does not have criteria to meet either diagnosis. Given her desire to conceive, placed referral to OB/GYN today and also encouraged her and her partner to reach out to a reproductive endocrinologist specialist or fertility specialist (do not require referrals) for further assistance. Collected a prolactin level today.  TSH in 08/2021  within normal limits. I am reassured by the fact that she is conceived naturally twice in the past. We will follow-up results with patient.  Urinary frequency Urinalysis dipstick with nitrites, leukocytes indicative of urine infection. We will treat with Macrobid 100 mg twice daily for 5 days Follow-up if symptoms do not resolve with treatment.     Darral Dash, DO [facility]

## 2022-02-09 NOTE — Assessment & Plan Note (Signed)
Urinalysis dipstick with nitrites, leukocytes indicative of urine infection. We will treat with Macrobid 100 mg twice daily for 5 days Follow-up if symptoms do not resolve with treatment.

## 2022-02-09 NOTE — Patient Instructions (Addendum)
Great meeting you today.  For your irregular periods, we collected some blood work.  I will call you if this is abnormal. I also placed a referral to OB/GYN.  However, there are reproductive endocrinologist (REI) and fertility clinics in the area that do not require referrals.  You can call them and schedule an appointment to discuss conceiving.  I placed a referral to audiology for difficulty hearing in your left ear.  They should call you to schedule an appointment.  If you do not hear from them within 2 weeks, please give our clinic a call.  Have a great weekend, Dr. Melissa Noon

## 2022-02-09 NOTE — Assessment & Plan Note (Addendum)
Irregular, but regular periods given that they occur about every 2 months.  Has had work-up in the past for abnormal periods.  Can consider PCOS and endometriosis, but this is much less likely as she does not have criteria to meet either diagnosis. Given her desire to conceive, placed referral to OB/GYN today and also encouraged her and her partner to reach out to a reproductive endocrinologist specialist or fertility specialist (do not require referrals) for further assistance. Collected a prolactin level today.  TSH in 08/2021 within normal limits. I am reassured by the fact that she is conceived naturally twice in the past. We will follow-up results with patient.

## 2022-02-10 LAB — CBC
Hematocrit: 36.3 % (ref 34.0–46.6)
Hemoglobin: 12.5 g/dL (ref 11.1–15.9)
MCH: 32.6 pg (ref 26.6–33.0)
MCHC: 34.4 g/dL (ref 31.5–35.7)
MCV: 95 fL (ref 79–97)
Platelets: 253 10*3/uL (ref 150–450)
RBC: 3.84 x10E6/uL (ref 3.77–5.28)
RDW: 12.6 % (ref 11.7–15.4)
WBC: 8.7 10*3/uL (ref 3.4–10.8)

## 2022-02-10 LAB — PROLACTIN: Prolactin: 17.6 ng/mL (ref 4.8–23.3)

## 2022-03-01 ENCOUNTER — Ambulatory Visit: Payer: Medicaid Other | Admitting: Audiology

## 2022-03-06 ENCOUNTER — Ambulatory Visit: Payer: Medicaid Other | Admitting: Audiology

## 2022-04-10 ENCOUNTER — Encounter: Payer: Self-pay | Admitting: Student

## 2022-04-10 ENCOUNTER — Ambulatory Visit (INDEPENDENT_AMBULATORY_CARE_PROVIDER_SITE_OTHER): Payer: Medicaid Other | Admitting: Student

## 2022-04-10 VITALS — BP 104/62 | HR 89 | Ht 63.0 in | Wt 190.2 lb

## 2022-04-10 DIAGNOSIS — B309 Viral conjunctivitis, unspecified: Secondary | ICD-10-CM

## 2022-04-10 MED ORDER — OLOPATADINE HCL 0.1 % OP SOLN: 1.0000 [drp] | Freq: Two times a day (BID) | OPHTHALMIC | 0 refills | Status: DC

## 2022-04-10 NOTE — Patient Instructions (Addendum)
It was great to see you today! Thank you for choosing [facility] for your primary care. Carol Oconnell was seen for viral conjunctivitis.  Today we addressed: Viral conjunctivitis: You had all the marks for this being viral.  I have prescribed you an eyedrop to use for the irritation that you may be getting.  Red flags keep an eye (pun intended) out for are vision loss, thick drainage, severe pain with eye movement.  Please wash your hands frequently.  You may return to work after the redness has resolved.  If you haven't already, sign up for My Chart to have easy access to your labs results, and communication with your primary care physician.  Call the clinic at [phone] if your symptoms worsen or you have any concerns.  You should return to our clinic Return if symptoms worsen or fail to improve. Please arrive 15 minutes before your appointment to ensure smooth check in process.  We appreciate your efforts in making this happen.  Thank you for allowing me to participate in your care, Carol Guiles, DO 04/10/2022, 9:35 AM PGY-2, [place]

## 2022-04-10 NOTE — Assessment & Plan Note (Signed)
Meets criteria for viral conjunctivitis.  Antihistamine otic drops for relief and work note provided to return after redness resolves.

## 2022-04-10 NOTE — Progress Notes (Signed)
  SUBJECTIVE:   CHIEF COMPLAINT / HPI:   Left eye irritation: Began yesterday afternoon. States this morning it was itchy, gunky, not having to clear it out regularly, endorses some blurry vision which took a couple minutes to adjust. Her eye was tearing this morning. Denies pain with movement. She wears daily contacts, has not worn them excessively,  She works in a daycare. No known exposures to STDs.  Denies further symptomatology such as fever, sore throat, congestion, cough.  PERTINENT  PMH / PSH: N/A  OBJECTIVE:  BP 104/62   Pulse 89   Ht 5\' 3"  (1.6 m)   Wt 190 lb 3.2 oz (86.3 kg)   SpO2 99%   BMI 33.69 kg/m  General: Awake, alert, NAD HEENT: Conjunctival erythema of the left eye with watery drainage, no sign of purulence, right eye normal, PERRL, EOMI, absence of pain with EOM; clear oropharynx  ASSESSMENT/PLAN:  Viral conjunctivitis Assessment & Plan: Meets criteria for viral conjunctivitis.  Antihistamine otic drops for relief and work note provided to return after redness resolves.  Orders: -     Olopatadine HCl; Place 1 drop into the left eye 2 (two) times daily. Until redness resolves.  Dispense: 5 mL; Refill: 0  Return if symptoms worsen or fail to improve. Wells Guiles, DO 04/10/2022, 10:19 AM PGY-2, [place]

## 2022-04-11 IMAGING — CR DG FOOT COMPLETE 3+V*L*
3 series · 3 of 3 positions shown · non-contrast
Comparison: None.

CLINICAL DATA: Stepped on a nail.

EXAM:
LEFT FOOT - COMPLETE 3+ VIEW

[x foot ap left]
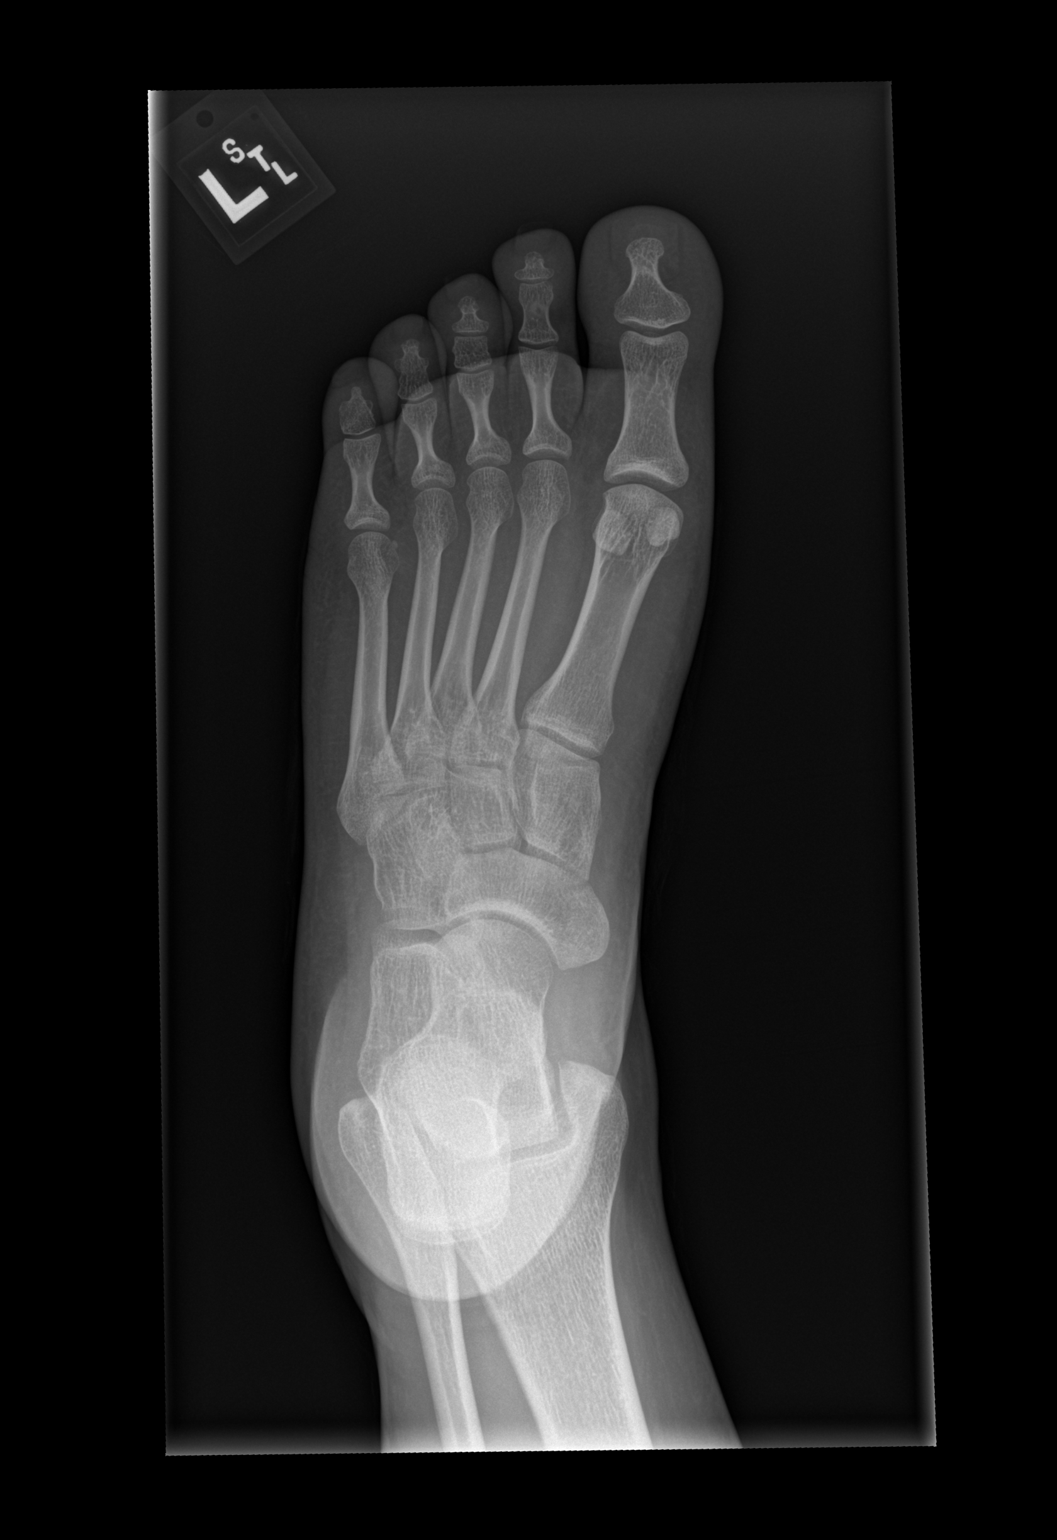

[x foot obl left]
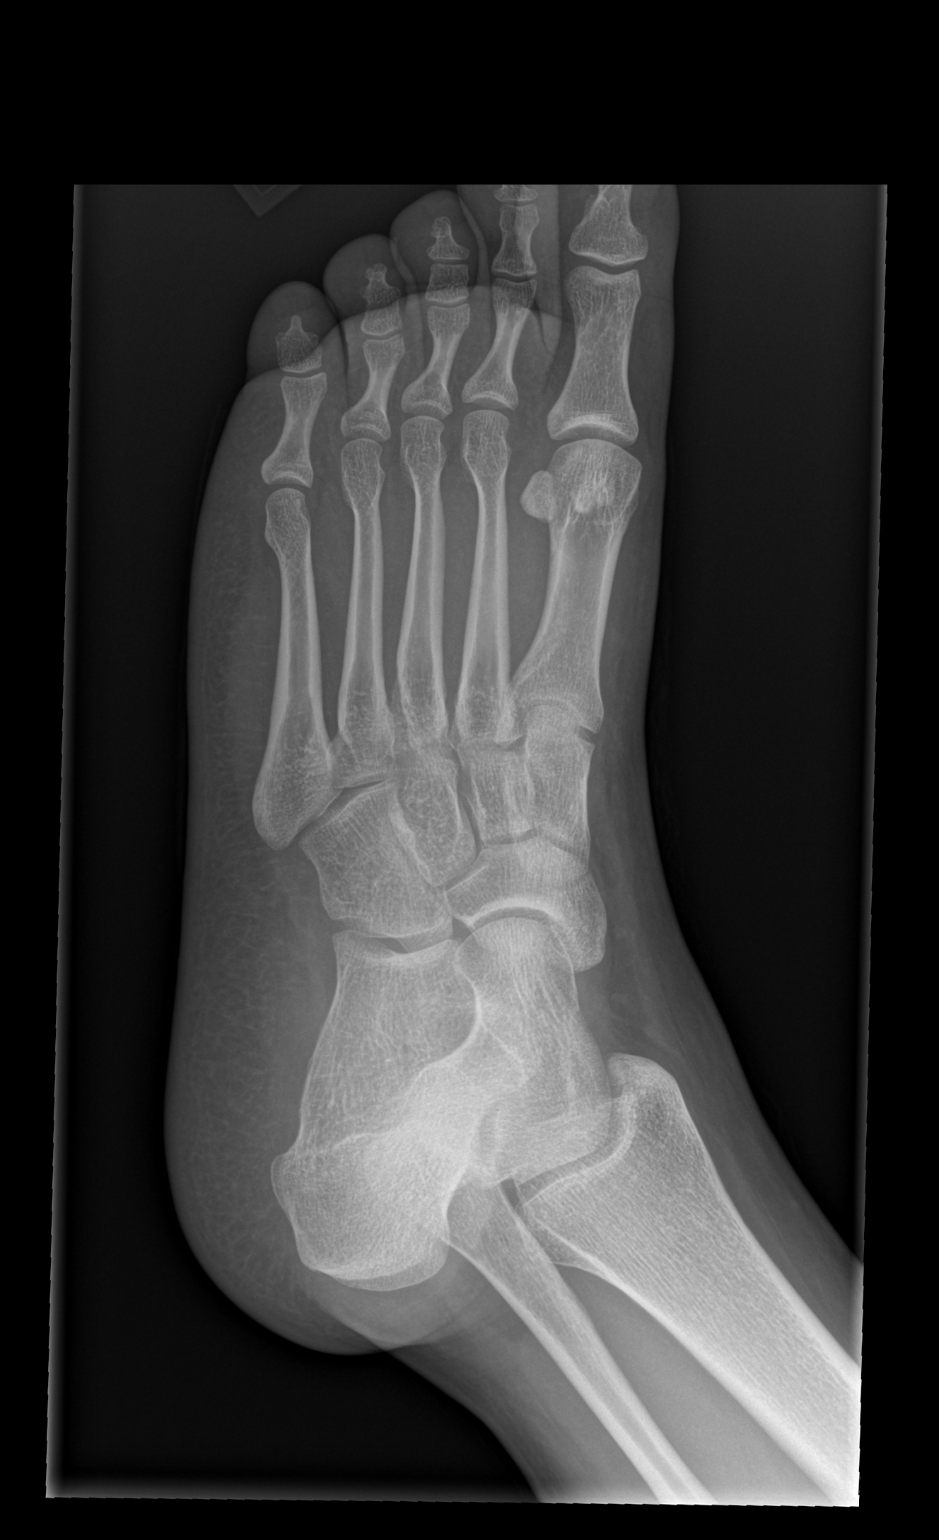

[x foot lat left]
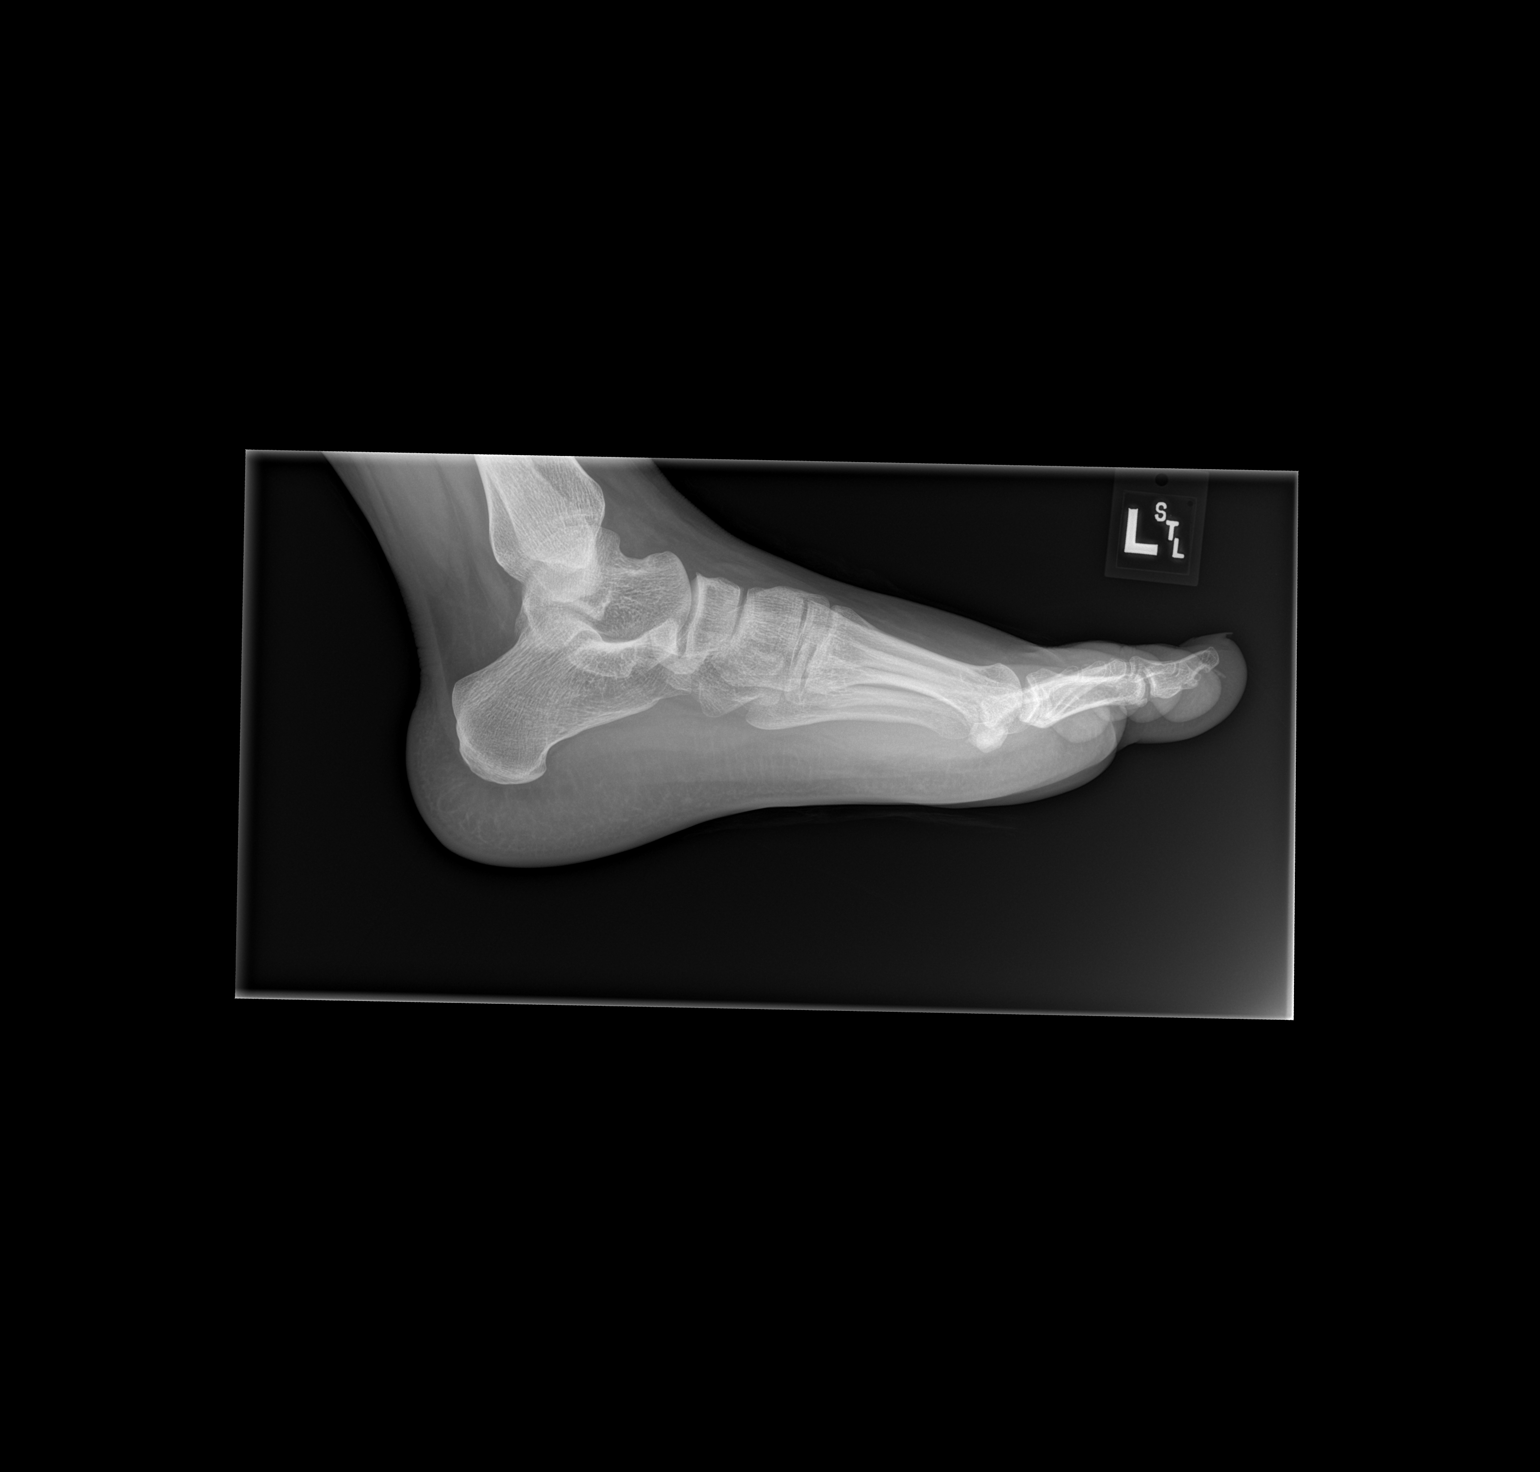

[3 of 3 positions shown; findings below may reference images not displayed]

FINDINGS: There is no evidence of fracture or dislocation. There is no
evidence of arthropathy or other focal bone abnormality. Soft
tissues are unremarkable.
IMPRESSION: Negative.

## 2022-08-02 ENCOUNTER — Encounter: Payer: Medicaid Other | Admitting: Obstetrics

## 2022-09-17 ENCOUNTER — Encounter: Payer: Self-pay | Admitting: Obstetrics and Gynecology

## 2022-09-17 ENCOUNTER — Ambulatory Visit (INDEPENDENT_AMBULATORY_CARE_PROVIDER_SITE_OTHER): Payer: Medicaid Other | Admitting: Obstetrics and Gynecology

## 2022-09-17 VITALS — BP 110/73 | HR 73 | Ht 63.5 in | Wt 194.0 lb

## 2022-09-17 DIAGNOSIS — Z01419 Encounter for gynecological examination (general) (routine) without abnormal findings: Secondary | ICD-10-CM

## 2022-09-17 DIAGNOSIS — N911 Secondary amenorrhea: Secondary | ICD-10-CM | POA: Diagnosis not present

## 2022-09-17 DIAGNOSIS — Z1339 Encounter for screening examination for other mental health and behavioral disorders: Secondary | ICD-10-CM | POA: Diagnosis not present

## 2022-09-17 LAB — POCT URINE PREGNANCY: Preg Test, Ur: NEGATIVE

## 2022-09-17 MED ORDER — MEDROXYPROGESTERONE ACETATE 10 MG PO TABS
10.0000 mg | ORAL_TABLET | Freq: Every day | ORAL | 2 refills | Status: AC
Start: 2022-09-17 — End: ?

## 2022-09-17 NOTE — Progress Notes (Signed)
GYNECOLOGY ANNUAL PREVENTATIVE CARE ENCOUNTER NOTE  History:     Carol Oconnell is a 27 y.o. [phone] female here for a routine annual gynecologic exam.  Current complaints: amenorrhea.    27 yo G2P2 seen for evaluation of amenorrhea.  Pt notes menarche at age 57-12.  Menses were not regular initially.  Pt was on OCPs in her teens to regulate cycle which was effective.  Pt got pregnant in 2014 after missing pills/taking them incorrectly.  After delivery she transitioned to the patch.  Pt got pregnant again in 2017.  After that delivery pt went on depo provera.  She discontinued the medication in 2018, but did gain 50 pounds on the medication.  She has since then had 6 years of irregular menses.  She now will skip 3-5 months at a time between menses.  Her LMP was 4 months ago.  UPT today was negative. Pt does have a female partner and desires pregnancy.  They have tried for pregnancy with outside sperm donor, but it has been ineffective.   Gynecologic History Patient's last menstrual period was 05/15/2022 (approximate). Contraception: none Last Pap: 10/2020. Results were: normal with negative HPV Last mammogram: n/a  Obstetric History OB History  Gravida Para Term Preterm AB Living  2 2 2     2   SAB IAB Ectopic Multiple Live Births        0 2    # Outcome Date GA Lbr Len/2nd Weight Sex Delivery Anes PTL Lv  2 Term 07/21/15 [redacted]w[redacted]d 02:02 / 00:05 7 lb 6.9 oz (3.37 kg) M Vag-Spont None  LIV  1 Term 01/12/14 [redacted]w[redacted]d  7 lb 7.8 oz (3.395 kg) M Vag-Spont EPI  LIV    Past Medical History:  Diagnosis Date   Anemia    Chlamydia     Past Surgical History:  Procedure Laterality Date   NO PAST SURGERIES      Current Outpatient Medications on File Prior to Visit  Medication Sig Dispense Refill   escitalopram (LEXAPRO) 20 MG tablet Take 1 tablet (20 mg total) by mouth daily. 90 tablet 0   hydrOXYzine (ATARAX) 25 MG tablet Take 1 tablet (25 mg total) by mouth 3 (three) times daily as needed.  (Patient not taking: Reported on 09/17/2022) 30 tablet 0   nitrofurantoin, macrocrystal-monohydrate, (MACROBID) 100 MG capsule Take 1 capsule (100 mg total) by mouth 2 (two) times daily. (Patient not taking: Reported on 09/17/2022) 10 capsule 0   olopatadine (PATADAY) 0.1 % ophthalmic solution Place 1 drop into the left eye 2 (two) times daily. Until redness resolves. (Patient not taking: Reported on 09/17/2022) 5 mL 0   No current facility-administered medications on file prior to visit.    No Known Allergies  Social History:  reports that she has never smoked. She has never used smokeless tobacco. She reports current alcohol use. She reports that she does not use drugs.  Family History  Problem Relation Age of Onset   Hypertension Mother    Asthma Brother     The following portions of the patient's history were reviewed and updated as appropriate: allergies, current medications, past family history, past medical history, past social history, past surgical history and problem list.  Review of Systems Pertinent items noted in HPI and remainder of comprehensive ROS otherwise negative.  Physical Exam:  BP 110/73   Pulse 73   Ht 5' 3.5" (1.613 m)   Wt 194 lb (88 kg)   LMP 05/15/2022 (Approximate)  BMI 33.83 kg/m  CONSTITUTIONAL: Well-developed, well-nourished female in no acute distress.  HENT:  Normocephalic, atraumatic, External right and left ear normal. Oropharynx is clear and moist EYES: Conjunctivae and EOM are normal.  NECK: Normal range of motion, supple, no masses.  Normal thyroid.  SKIN: Skin is warm and dry. No rash noted. Not diaphoretic. No erythema. No pallor.  No acanthosis nigricans MUSCULOSKELETAL: Normal range of motion. No tenderness.  No cyanosis, clubbing, or edema.  2+ distal pulses. NEUROLOGIC: Alert and oriented to person, place, and time. Normal reflexes, muscle tone coordination.  PSYCHIATRIC: Normal mood and affect. Normal behavior. Normal judgment and  thought content. CARDIOVASCULAR: Normal heart rate noted, regular rhythm RESPIRATORY: Clear to auscultation bilaterally. Effort and breath sounds normal, no problems with respiration noted. BREASTS: deferred ABDOMEN: Soft, no distention noted.  No tenderness, rebound or guarding.  PELVIC: Normal appearing external genitalia and urethral meatus; normal appearing vaginal mucosa and cervix.  No abnormal discharge noted.   Normal uterine size, no other palpable masses, no uterine or adnexal tenderness.  Performed in the presence of a chaperone.   Assessment and Plan:    1. Women's annual routine gynecological examination [Z01.419] Normal annual exam  2. Amenorrhea, secondary Unsure of etiology of amenorrhea.  PCOS may be an option, and pt has been advised to attempt 10%  weight loss.  Will check hormone panel and follow progesterone challenge.  If challenge is successful, continue OCP for cycle control or consider clomid to aid in ovulation.  Due to same sex relationship, pt may benefit from fertility specialist and donor IUI.  - POCT urine pregnancy - Thyroid Panel With TSH - Follicle stimulating hormone - Prolactin - medroxyPROGESTERone (PROVERA) 10 MG tablet; Take 1 tablet (10 mg total) by mouth daily. Use for ten days  Dispense: 10 tablet; Refill: 2  Routine preventative health maintenance measures emphasized. Please refer to After Visit Summary for other counseling recommendations.     Can follow up after progesterone challenge.  Mariel Aloe, MD, FACOG Obstetrician & Gynecologist, [facility] for [facility], [facility]

## 2022-09-17 NOTE — Progress Notes (Addendum)
27 y.o. New GYN presents for AUB, c/o missing period for 4 months, not using BC, cramps 7/10 x 4 years.  Last PAP 11/01/20 NILM

## 2022-09-18 LAB — THYROID PANEL WITH TSH
Free Thyroxine Index: 1.5 (ref 1.2–4.9)
T3 Uptake Ratio: 24 % (ref 24–39)
T4, Total: 6.4 ug/dL (ref 4.5–12.0)
TSH: 2.15 u[IU]/mL (ref 0.450–4.500)

## 2022-09-18 LAB — FOLLICLE STIMULATING HORMONE: FSH: 6.3 m[IU]/mL

## 2022-09-18 LAB — PROLACTIN: Prolactin: 9.2 ng/mL (ref 4.8–33.4)

## 2023-05-13 ENCOUNTER — Ambulatory Visit: Payer: Medicaid Other | Admitting: Obstetrics and Gynecology

## 2023-05-13 VITALS — BP 110/77 | HR 88 | Wt 204.0 lb

## 2023-05-13 DIAGNOSIS — N926 Irregular menstruation, unspecified: Secondary | ICD-10-CM | POA: Diagnosis not present

## 2023-05-13 DIAGNOSIS — N979 Female infertility, unspecified: Secondary | ICD-10-CM

## 2023-05-13 MED ORDER — LETROZOLE 2.5 MG PO TABS
2.5000 mg | ORAL_TABLET | Freq: Every day | ORAL | 2 refills | Status: AC
Start: 2023-05-13 — End: ?

## 2023-05-13 NOTE — Patient Instructions (Addendum)
Discussed Letrozole which has been shown to be superior to Clomid in ovulation induction, in PCOS patients with BMI >30.  Patient agreed to try this.  Letrozole prescribed, 2.5 mg po daily on days 3 to 7 following a spontaneous menses or progestin-induced bleed. If the cycle is ovulatory but pregnancy has not occurred, the same dose should be used in the next cycle. If ovulation does not occur, the dose should be increased to 5 mg/day cycle days 3 to 7, with a maximal dose of 7.5 mg/day.    Continue regular intercourse   Call first day of next cycle, will need to plan for day 21 progesterone

## 2023-05-13 NOTE — Progress Notes (Unsigned)
Pt is in office for f/u cycles and med use - pt states she is currently taking no medication.   Cycles have been regular x 2-3 cycles. Pt would like future pregnancy.

## 2023-05-14 NOTE — Progress Notes (Signed)
  CC: fertility questions Subjective:    Patient ID: Carol Oconnell, female    DOB: 26-Aug-1995, 26 y.o.   MRN: [id number]  HPI 28 yo G2P2 seen for follow up.  Pt last seen 08/2022.  Pt never took the provera.  She did have a cycle 7/24 which lasted 10-12 days.  Pt advised to lose 10% body weight to decrease possible insulin resistance.  Pt has gained 10 pounds since last visit.  Pt has also had menses September and December-January.  Pt is interested in cycle control as well as getting pregnant.  Pt advised that these two goals cannot be accomplished at the same time.  Pt desires pregnancy is more important.      Review of Systems     Objective:   Physical Exam Vitals:   05/13/23 1355  BP: 110/77  Pulse: 88         Assessment & Plan:   1. Irregular menses (Primary) Menses mostly regular, could consider OCP for cycle control, but pt desires pregnancy.  2. Female fertility problem Trial of letrozole for pregnancy.  Pt advised tom start PNV now.  Pt to call day 1 of menses and then schedule day 21 progesterone.  - letrozole (FEMARA) 2.5 MG tablet; Take 1 tablet (2.5 mg total) by mouth daily. Take on days 3 to 7 following a spontaneous menses or progestin-induced bleed.  Dispense: 5 tablet; Refill: 2    Warden Fillers, MD Faculty Attending, Center for [facility]

## 2023-06-27 ENCOUNTER — Ambulatory Visit

## 2023-06-27 VITALS — BP 131/79 | HR 90 | Ht 61.0 in | Wt 204.0 lb

## 2023-06-27 DIAGNOSIS — N926 Irregular menstruation, unspecified: Secondary | ICD-10-CM | POA: Diagnosis not present

## 2023-06-27 DIAGNOSIS — Z3169 Encounter for other general counseling and advice on procreation: Secondary | ICD-10-CM

## 2023-06-27 MED ORDER — PREPLUS 27-1 MG PO TABS: 1.0000 | ORAL_TABLET | Freq: Every day | ORAL | 3 refills | Status: DC

## 2023-06-27 NOTE — Progress Notes (Signed)
 Saw Dr. Donavan Foil prior. Desires pregnancy. Last menses more heavy than usual with clots and cramping. What to do for F/U?

## 2023-06-27 NOTE — Progress Notes (Signed)
   GYNECOLOGY FOLLOW UP OFFICE VISIT NOTE  History:  Carol Oconnell is a 28 y.o. [phone] here today for follow up for irregular menses.  She was seen by Dr. Hazle Coca on May 13, 2023 and was instructed to start on Letrozole on day 3 of her next cycle.  She states her cycle started March 26th-30th and was extremely heavy.  Patient reports she was changing her super tampon every hour.  She also reports clots the size of a raspberry. She endorses cramping and reports taking tylenol without issues. Patient states that she did not take the Letrozole because her donor was not readily available.  She would like to know why her menstrual cycle has changed and what next steps are.    Past Medical History:  Diagnosis Date   Anemia    Chlamydia     Past Surgical History:  Procedure Laterality Date   NO PAST SURGERIES      The following portions of the patient's history were reviewed and updated as appropriate: allergies, current medications, past family history, past medical history, past social history, past surgical history and problem list.   Health Maintenance:  Normal pap and negative HRHPV on August 2022.  No mammogram on file d/t age.   Review of Systems:  Genito-Urinary ROS: negative Gastrointestinal ROS: negative Objective:  Vitals: BP 131/79   Pulse 90   Ht 5\' 1"  (1.549 m)   Wt 204 lb (92.5 kg)   LMP 06/20/2023 (Exact Date)   BMI 38.55 kg/m   Physical Exam: Physical Exam Constitutional:      Appearance: Normal appearance.  HENT:     Head: Normocephalic and atraumatic.  Eyes:     Conjunctiva/sclera: Conjunctivae normal.  Cardiovascular:     Rate and Rhythm: Normal rate.  Pulmonary:     Effort: Pulmonary effort is normal. No respiratory distress.  Musculoskeletal:        General: Normal range of motion.     Cervical back: Normal range of motion.  Neurological:     Mental Status: She is alert and oriented to person, place, and time.  Skin:    General: Skin is warm and  dry.  Psychiatric:        Mood and Affect: Mood normal.        Behavior: Behavior normal.  Vitals and nursing note reviewed.      Labs and Imaging: No results found.  Assessment & Plan:  28 year old Irregular Menses PreConception Counseling  -Extensive discussion regarding changes in menstrual cycle and management.  -Informed that with desire for conception focus should be on previously established plan.  -Instructed to await next menses and take Letrozole from Day 3 to 7 then come for Day 21 progesterone level.  -Reviewed returning for change in plan if no longer desiring conception. -Further instructed to continue to monitor bleeding patterns and report, but encouraged to focus on conception. -Patient agreeable with plan. -Rx for PNV sent to pharmacy on file.    Total face-to-face time with patient:  10  minutes Additional 6 minutes required for reviewing of chart including labs, previous visits, and results.   Total time 16 minutes.   Gerrit Heck, CNM 06/27/2023 4:44 PM

## 2023-09-03 ENCOUNTER — Encounter: Payer: Self-pay | Admitting: *Deleted

## 2024-04-14 ENCOUNTER — Ambulatory Visit (HOSPITAL_COMMUNITY)
Admission: EM | Admit: 2024-04-14 | Discharge: 2024-04-14 | Disposition: A | Discharge status: Home / Self Care | Attending: Family Medicine | Admitting: Family Medicine

## 2024-04-14 ENCOUNTER — Other Ambulatory Visit: Payer: Self-pay

## 2024-04-14 DIAGNOSIS — J069 Acute upper respiratory infection, unspecified: Secondary | ICD-10-CM | POA: Diagnosis not present

## 2024-04-14 DIAGNOSIS — J029 Acute pharyngitis, unspecified: Secondary | ICD-10-CM | POA: Diagnosis not present

## 2024-04-14 MED ORDER — HYDROCODONE BIT-HOMATROP MBR 5-1.5 MG/5ML PO SOLN: 5.0000 mL | Freq: Four times a day (QID) | ORAL | 0 refills | Status: AC | PRN

## 2024-04-14 NOTE — ED Triage Notes (Signed)
 PT reports since SAt she has had a soret hroat .cough and loss of appetite .

## 2024-04-14 NOTE — ED Provider Notes (Signed)
 " [facility]   [id number] 04/14/24 Arrival Time: 0913  ASSESSMENT & PLAN:  1. Viral URI with cough   2. Sore throat    Discussed typical duration of likely viral illness. OTC symptom care as needed. Work note provided.  Meds ordered this encounter  Medications   HYDROcodone  bit-homatropine (HYCODAN) 5-1.5 MG/5ML syrup    Sig: Take 5 mLs by mouth every 6 (six) hours as needed for cough.    Dispense:  90 mL    Refill:  0     Follow-up Information     Lupie Credit, DO.   Specialty: Family Medicine Why: As needed. Contact information: [address] [place] [address] [phone]                 Reviewed expectations re: course of current medical issues. Questions answered. Outlined signs and symptoms indicating need for more acute intervention. Understanding verbalized. After Visit Summary given.   SUBJECTIVE: History from: Patient. Carol Oconnell is a 29 y.o. female. Few days; ST and cough; decreased appetite. Just feel bad. Denies fever/SOB. Denies n/v/d. No tx PTA.  OBJECTIVE:  Vitals:   04/14/24 1056  BP: 138/81  Pulse: 99  Resp: 20  Temp: 98.6 F (37 C)  SpO2: 95%    General appearance: alert; no distress Eyes: PERRLA; EOMI; conjunctiva normal HENT: [place]; AT; with nasal congestion Neck: supple  Lungs: speaks full sentences without difficulty; unlabored; CTAB; active cough Extremities: no edema Skin: warm and dry Neurologic: normal gait Psychological: alert and cooperative; normal mood and affect  Labs:  Labs Reviewed - No data to display  Imaging: No results found.  Allergies[1]  Past Medical History:  Diagnosis Date   Anemia    Chlamydia    Social History   Socioeconomic History   Marital status: Single    Spouse name: Not on file   Number of children: Not on file   Years of education: Not on file   Highest education level: Not on file  Occupational History   Occupation: student     Comment: Psychology.  [employer]  Tobacco Use   Smoking status: Never   Smokeless tobacco: Never  Vaping Use   Vaping status: Never Used  Substance and Sexual Activity   Alcohol use: Yes   Drug use: No   Sexual activity: Yes    Partners: Male    Birth control/protection: None    Comment: with boyfriend  Other Topics Concern   Not on file  Social History Narrative   Have two children (boys 2 years and 7 months). Mother and father helping   Social Drivers of Health   Tobacco Use: Low Risk (06/27/2023)   Patient History    Smoking Tobacco Use: Never    Smokeless Tobacco Use: Never    Passive Exposure: Not on file  Financial Resource Strain: Not on file  Food Insecurity: Not on file  Transportation Needs: Not on file  Physical Activity: Not on file  Stress: Not on file  Social Connections: Not on file  Intimate Partner Violence: Not on file  Depression (PHQ2-9): Low Risk (09/17/2022)   Depression (PHQ2-9)    PHQ-2 Score: 0  Alcohol Screen: Not on file  Housing: Not on file  Utilities: Not on file  Health Literacy: Not on file   Family History  Problem Relation Age of Onset   Hypertension Mother    Asthma Brother    Past Surgical History:  Procedure Laterality Date   NO  PAST SURGERIES        [1] No Known Allergies    Rolinda Rogue, MD 04/14/24 1617  "
